# Patient Record
Sex: Female | Born: 1965
Health system: Southern US, Community
[De-identification: ages and names within clinical notes are randomized; demographics above are authoritative.]

## PROBLEM LIST (undated history)

## (undated) DIAGNOSIS — T7840XA Allergy, unspecified, initial encounter: Secondary | ICD-10-CM

## (undated) DIAGNOSIS — Z86010 Personal history of colonic polyps: Secondary | ICD-10-CM

## (undated) DIAGNOSIS — B441 Other pulmonary aspergillosis: Secondary | ICD-10-CM

## (undated) DIAGNOSIS — R918 Other nonspecific abnormal finding of lung field: Secondary | ICD-10-CM

## (undated) DIAGNOSIS — F419 Anxiety disorder, unspecified: Secondary | ICD-10-CM

## (undated) DIAGNOSIS — C801 Malignant (primary) neoplasm, unspecified: Secondary | ICD-10-CM

## (undated) DIAGNOSIS — F32A Depression, unspecified: Secondary | ICD-10-CM

## (undated) DIAGNOSIS — E785 Hyperlipidemia, unspecified: Secondary | ICD-10-CM

## (undated) HISTORY — PX: THORACOTOMY/LOBECTOMY: SHX6116

## (undated) HISTORY — DX: Allergy, unspecified, initial encounter: T78.40XA

## (undated) HISTORY — PX: KNEE ARTHROSCOPY: SUR90

## (undated) HISTORY — DX: Malignant (primary) neoplasm, unspecified: C80.1

## (undated) HISTORY — DX: Hyperlipidemia, unspecified: E78.5

## (undated) HISTORY — DX: Depression, unspecified: F32.A

## (undated) HISTORY — DX: Personal history of colonic polyps: Z86.010

## (undated) HISTORY — DX: Other pulmonary aspergillosis: B44.1

---

## 2003-12-30 ENCOUNTER — Other Ambulatory Visit: Admission: RE | Admit: 2003-12-30 | Discharge: 2003-12-30 | Payer: Self-pay | Admitting: Gynecology

## 2004-05-05 ENCOUNTER — Encounter: Admission: RE | Admit: 2004-05-05 | Discharge: 2004-05-05 | Payer: Self-pay | Admitting: Internal Medicine

## 2004-05-17 ENCOUNTER — Encounter: Admission: RE | Admit: 2004-05-17 | Discharge: 2004-05-17 | Payer: Self-pay | Admitting: Internal Medicine

## 2004-11-16 ENCOUNTER — Ambulatory Visit: Payer: Self-pay | Admitting: Internal Medicine

## 2005-05-12 ENCOUNTER — Inpatient Hospital Stay (HOSPITAL_COMMUNITY): Admission: RE | Admit: 2005-05-12 | Discharge: 2005-05-14 | Payer: Self-pay | Admitting: Obstetrics and Gynecology

## 2005-05-30 ENCOUNTER — Other Ambulatory Visit: Admission: RE | Admit: 2005-05-30 | Discharge: 2005-05-30 | Payer: Self-pay | Admitting: Obstetrics and Gynecology

## 2005-08-07 ENCOUNTER — Ambulatory Visit: Payer: Self-pay | Admitting: Psychology

## 2005-09-04 ENCOUNTER — Ambulatory Visit: Payer: Self-pay | Admitting: Psychology

## 2005-09-05 ENCOUNTER — Ambulatory Visit: Payer: Self-pay | Admitting: Internal Medicine

## 2005-09-11 ENCOUNTER — Ambulatory Visit: Payer: Self-pay | Admitting: Psychology

## 2005-09-12 ENCOUNTER — Ambulatory Visit: Payer: Self-pay | Admitting: Internal Medicine

## 2005-09-15 ENCOUNTER — Ambulatory Visit: Payer: Self-pay | Admitting: Psychology

## 2005-09-25 ENCOUNTER — Ambulatory Visit: Payer: Self-pay | Admitting: Psychology

## 2005-10-09 ENCOUNTER — Ambulatory Visit: Payer: Self-pay | Admitting: Psychology

## 2005-10-16 ENCOUNTER — Ambulatory Visit: Payer: Self-pay | Admitting: Psychology

## 2005-11-06 ENCOUNTER — Ambulatory Visit: Payer: Self-pay | Admitting: Psychology

## 2005-11-20 ENCOUNTER — Ambulatory Visit: Payer: Self-pay | Admitting: Psychology

## 2005-12-04 ENCOUNTER — Ambulatory Visit: Payer: Self-pay | Admitting: Psychology

## 2006-01-01 ENCOUNTER — Ambulatory Visit: Payer: Self-pay | Admitting: Psychology

## 2006-01-12 ENCOUNTER — Ambulatory Visit: Payer: Self-pay | Admitting: Endocrinology

## 2007-07-03 ENCOUNTER — Telehealth: Payer: Self-pay | Admitting: Internal Medicine

## 2007-08-17 ENCOUNTER — Encounter: Payer: Self-pay | Admitting: Internal Medicine

## 2007-08-17 DIAGNOSIS — F411 Generalized anxiety disorder: Secondary | ICD-10-CM | POA: Insufficient documentation

## 2007-11-15 ENCOUNTER — Telehealth (INDEPENDENT_AMBULATORY_CARE_PROVIDER_SITE_OTHER): Payer: Self-pay | Admitting: *Deleted

## 2010-06-16 ENCOUNTER — Ambulatory Visit: Payer: Self-pay | Admitting: Internal Medicine

## 2010-06-16 DIAGNOSIS — R109 Unspecified abdominal pain: Secondary | ICD-10-CM | POA: Insufficient documentation

## 2010-06-16 LAB — CONVERTED CEMR LAB
Bilirubin Urine: NEGATIVE
Hemoglobin, Urine: NEGATIVE
Leukocytes, UA: NEGATIVE
Specific Gravity, Urine: <=1.005 (ref 1.000–1.030)
Total Protein, Urine: NEGATIVE mg/dL
Urine Glucose: NEGATIVE mg/dL
Urobilinogen, UA: 0.2 (ref 0.0–1.0)

## 2010-06-17 ENCOUNTER — Telehealth: Payer: Self-pay | Admitting: Internal Medicine

## 2010-06-17 DIAGNOSIS — K219 Gastro-esophageal reflux disease without esophagitis: Secondary | ICD-10-CM | POA: Insufficient documentation

## 2010-06-17 DIAGNOSIS — F418 Other specified anxiety disorders: Secondary | ICD-10-CM | POA: Insufficient documentation

## 2010-11-20 ENCOUNTER — Encounter: Payer: Self-pay | Admitting: Internal Medicine

## 2010-12-01 NOTE — Progress Notes (Signed)
  Phone Note Outgoing Call   Reason for Call: Discuss lab or test results Summary of Call: U/A negative - stone unlikely. Will move ahead with U/S  [facility] will call. Late PM appt requested.  Initial call taken by: Jacques Navy MD,  June 17, 2010 8:33 AM  Follow-up for Phone Call         informed pt  Follow-up by: Ami Bullins CMA,  June 17, 2010 8:50 AM

## 2010-12-01 NOTE — Assessment & Plan Note (Signed)
Summary: STOMACH PAIN/NWS   Vital Signs:  Patient profile:   45 year old female Height:      64 inches Weight:      138 pounds BMI:     23.77 O2 Sat:      99 % on Room air Temp:     98.7 degrees F oral Pulse rate:   68 / minute BP sitting:   102 / 64  (left arm) Cuff size:   regular  Vitals Entered By: Bill Salinas CMA (June 16, 2010 3:22 PM)  O2 Flow:  Room air CC: pt here with c/o of pain on left side x 2 days/ ab   CC:  pt here with c/o of pain on left side x 2 days/ ab.  History of Present Illness: Patient is seen acutely for left flank pain.  Christina [name] usually enjoys good health. Several days ago she had the on-set of severe pain in the left flank which she rated as 7/10. It was slightly colicky in nature with some downward radiation. She denies Fever,sweats, chills, dysuria, hematuria, N/V. She has no prior GU problems. The pain has lessened although it is still present.   Current Medications (verified): 1)  Multivitamins   Tabs (Multiple Vitamin) .... 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  The patient denies anorexia, fever, weight loss, weight gain, chest pain, syncope, dyspnea on exertion, and prolonged cough.    Physical Exam  General:  Well-developed,well-nourished,in no acute distress; alert,appropriate and  cooperative throughout examination Head:  normocephalic.   Eyes:  pupils equal, pupils round, and corneas and lenses clear.   Neck:  full ROM.   Lungs:  normal respiratory effort.   Heart:  normal rate and regular rhythm.   Abdomen:  soft, normal bowel sounds, no distention, no guarding, and no rigidity.  Tender to percussion over the left flank. Tender to deep palpation at left quadrant at the level of the umbilicus. No lower abdominal tenderness Msk:  normal ROM, no redness over joints, and no joint deformities.   Pulses:  2+ radial Neurologic:  alert & oriented X3, cranial nerves II-XII intact, and gait normal.   Skin:  turgor normal, color normal, and no ulcerations.   Psych:  Oriented X3, memory intact for recent and remote, normally interactive, and good eye contact.     Impression & Recommendations:  Problem # 1:  FLANK PAIN, LEFT (ICD-789.09) New on-set flank pain suggestive of nephrolithiasis. No evidence of infection. Alternatively may be muscle strain vs renal parenchymal disease.  Plan - U/A           if negative will move ahead with renal U/S  Orders: TLB-Udip w/ Micro (81001-URINE) Radiology Referral (Radiology)  Addendum -U/A negative on dipstick, no micro report.   Plan - schedule renal U/S  Complete Medication List: 1)  Multivitamins  Tabs (Multiple vitamin) .... Take 1 by mouth qd

## 2011-03-17 NOTE — Op Note (Signed)
Christina, [name]                 ACCOUNT NO.:  [id number]   MEDICAL RECORD NO.:  [id number]          PATIENT TYPE:  INP   LOCATION:  9117                          FACILITY:  [facility]   PHYSICIAN:  Malva Limes, M.D.    DATE OF BIRTH:  05-Jun-1966   DATE OF PROCEDURE:  05/12/2005  DATE OF DISCHARGE:                                 OPERATIVE REPORT   PREOPERATIVE DIAGNOSES:  1.  Intrauterine pregnancy at term.  2.  History of previous child with cerebral palsy.  3.  Advanced maternal age.  4.  Patient desires elective cesarean section.   POSTOPERATIVE DIAGNOSES:  1.  Intrauterine pregnancy at term.  2.  History of previous child with cerebral palsy.  3.  Advanced maternal age.  4.  Patient desires elective cesarean section.   PROCEDURE:  Primary elective cesarean section.   SURGEON:  Malva Limes, M.D.   ANESTHESIA:  Spinal.   ANTIBIOTICS:  Ancef 1 g.   ESTIMATED BLOOD LOSS:  900 mL.   COMPLICATIONS:  None.   SPECIMENS:  None.   DRAINS:  Foley to bedside drainage.   FINDINGS:  The patient had normal fallopian tubes and ovaries bilaterally.  The uterus appeared to be normal.  The patient delivered one live viable  white female infant.  The placenta appeared to be normal.   PROCEDURE:  The patient was taken to the operating room, where a spinal  anesthetic was placed without difficulty.  She was then placed in the dorsal  supine position with a left lateral tilt.  She was prepped with Hibiclens  and draped in the usual fashion for this procedure.  A Foley catheter was  placed in her bladder.  She was then checked and anesthetic level was  adequate.  A Pfannenstiel incision was made.  This was carried down to the  fascia.  The fascia was entered in the midline and extended laterally with  the Mayo scissors.  The muscles were dissected from the fascia with the  Bovie.  Rectus muscles were divided in the midline and taken superiorly and  inferiorly.  The parietal peritoneum  was entered sharply.  The bladder blade  was taken down sharply.  A low transverse uterine incision was made in the  midline and extended laterally with blunt dissection.  Amniotic fluid was  noted to be clear.  The infant was delivered in the vertex presentation.  On  delivery of the head, the oropharynx was then bulb-suctioned.  The infant  was handed to the waiting NICU team.  Cord blood was then obtained.  The  placenta was manually removed.  The uterus was explored and wiped with a wet  lap.  The uterine incision was closed in a single layer of 0 Monocryl in a  running locking fashion.  The bladder flap was closed using 2-0 Monocryl in  a running fashion.  The uterus was placed back in the abdominal cavity.  Hemostasis was checked and found to be adequate.  The parietal peritoneum  and rectus muscles were reapproximated in the midline using 2-0 Monocryl in  a running fashion, and the fascia was closed using 0 Monocryl suture in a  running fashion.  Subcuticular tissue was made hemostatic with Bovie.  Stainless steel clips were used to close the skin.  The patient tolerated  the procedure well, and she was taken to the recovery room in stable  condition.  Instrument and lap counts were correct x2.       MA/MEDQ  D:  05/12/2005  T:  05/12/2005  Job:  [id number]

## 2011-03-17 NOTE — Discharge Summary (Signed)
NAMEYASMENE, Christina [name]                 ACCOUNT NO.:  [id number]   MEDICAL RECORD NO.:  [id number]          PATIENT TYPE:  INP   LOCATION:  9117                          FACILITY:  [facility]   PHYSICIAN:  Malva Limes, M.D.    DATE OF BIRTH:  [birth year]   DATE OF ADMISSION:  05/12/2005  DATE OF DISCHARGE:  05/14/2005                                 DISCHARGE SUMMARY   FINAL DIAGNOSES:  1.  Intrauterine pregnancy at term.  2.  History of a previous child with cerebral palsy.  3.  Advanced maternal age.  4.  The patient desires elective cesarean section.   PROCEDURE:  Primary elective low transverse cesarean section.   SURGEON:  Dr. Malva Limes.   COMPLICATIONS:  None.   HOSPITAL COURSE:  This 45 year old G3, P 1-0-1-1 presents at term for an  elective cesarean section. The patient had had a previous child born with  cerebral palsy and desires a cesarean section with this pregnancy.  The  patient is also advanced maternal age. She did have first trimester  screening performed which was within normal limits. The patient herself was  born with questionable polio.  Otherwise, the patient's antepartum course  had been uncomplicated. She did have a negative group B strep culture  obtained in the office at 35 weeks. She was taken to the operating room on  May 12, 2005 by Dr. Malva Limes where a primary low transverse cesarean  section was performed with the delivery of a 7 pound, 9 ounce female infant  without Apgars of 8/9.  Delivery went without complications. The patient's  postoperative course was benign without significant fevers. She was felt  ready for discharge on postoperative day #2.   DISCHARGE DIET:  She was sent home on a regular diet.   ACTIVITY:  She was told to decrease activities.   DISCHARGE MEDICATIONS:  1.  She was told to continue her prenatal vitamins.  2.  She was given Percocet 1-2 every 4 hours as needed for pain.  3.  She was told she could use Motrin up to 600  milligrams every 6 hours as      needed for pain.   FOLLOW UP:  She was to follow up in the office in 2 weeks with Dr. Dareen Piano  for an incision check.   LABORATORIES ON DISCHARGE:  The patient had a hemoglobin of 9.6, white blood  cell count of 11.0, platelets of 141,000.      Leilani Able, P.A.-C.    ______________________________  Malva Limes, M.D.    MB/MEDQ  D:  06/15/2005  T:  06/15/2005  Job:  [id number]

## 2012-12-17 ENCOUNTER — Encounter: Payer: Self-pay | Admitting: *Deleted

## 2012-12-18 ENCOUNTER — Ambulatory Visit (INDEPENDENT_AMBULATORY_CARE_PROVIDER_SITE_OTHER): Payer: Managed Care, Other (non HMO) | Admitting: *Deleted

## 2012-12-18 ENCOUNTER — Encounter: Payer: Self-pay | Admitting: Vascular Surgery

## 2012-12-18 DIAGNOSIS — I781 Nevus, non-neoplastic: Secondary | ICD-10-CM

## 2012-12-18 NOTE — Progress Notes (Signed)
X=.3% Sotradecol administered with a 27g butterfly.  Patient received a total of 6cc.  This nice lady has areas behind each knee that were easily treated and I anticipate good results. Will follow her prn.  Photos: yes  Compression stockings applied: yes

## 2014-10-30 HISTORY — PX: COLONOSCOPY: SHX174

## 2014-12-24 ENCOUNTER — Telehealth: Payer: Self-pay | Admitting: Internal Medicine

## 2014-12-24 NOTE — Telephone Encounter (Signed)
Rec'd from [facility] forward 13 pages to Dr. Olevia Perches

## 2014-12-29 ENCOUNTER — Encounter: Payer: Self-pay | Admitting: Internal Medicine

## 2015-03-05 ENCOUNTER — Encounter: Payer: Self-pay | Admitting: Internal Medicine

## 2015-03-05 ENCOUNTER — Ambulatory Visit (AMBULATORY_SURGERY_CENTER): Payer: Self-pay

## 2015-03-05 VITALS — Ht 62.0 in | Wt 136.8 lb

## 2015-03-05 DIAGNOSIS — Z800 Family history of malignant neoplasm of digestive organs: Secondary | ICD-10-CM

## 2015-03-05 NOTE — Progress Notes (Signed)
Per pt, no allergies to soy or egg products.Pt not taking any weight loss meds or using  O2 at home. 

## 2015-03-19 ENCOUNTER — Encounter: Payer: Self-pay | Admitting: Internal Medicine

## 2015-03-19 ENCOUNTER — Ambulatory Visit (AMBULATORY_SURGERY_CENTER): Payer: Managed Care, Other (non HMO) | Admitting: Internal Medicine

## 2015-03-19 VITALS — BP 113/56 | HR 50 | Temp 97.6°F | Resp 22 | Ht 62.0 in | Wt 136.0 lb

## 2015-03-19 DIAGNOSIS — Z86010 Personal history of colonic polyps: Secondary | ICD-10-CM | POA: Diagnosis present

## 2015-03-19 DIAGNOSIS — D123 Benign neoplasm of transverse colon: Secondary | ICD-10-CM

## 2015-03-19 DIAGNOSIS — Z800 Family history of malignant neoplasm of digestive organs: Secondary | ICD-10-CM | POA: Diagnosis not present

## 2015-03-19 DIAGNOSIS — D124 Benign neoplasm of descending colon: Secondary | ICD-10-CM

## 2015-03-19 DIAGNOSIS — Z860101 Personal history of adenomatous and serrated colon polyps: Secondary | ICD-10-CM

## 2015-03-19 DIAGNOSIS — D125 Benign neoplasm of sigmoid colon: Secondary | ICD-10-CM | POA: Diagnosis not present

## 2015-03-19 HISTORY — DX: Personal history of colonic polyps: Z86.010

## 2015-03-19 HISTORY — DX: Personal history of adenomatous and serrated colon polyps: Z86.0101

## 2015-03-19 MED ORDER — SODIUM CHLORIDE 0.9 % IV SOLN: 500.0000 mL | INTRAVENOUS | Status: DC

## 2015-03-19 NOTE — Op Note (Addendum)
[place]  [address] [place], [address]   COLONOSCOPY PROCEDURE REPORT  PATIENT: Christina [name], Christina [name]  MR#: [id number] BIRTHDATE: December 12, 1965 , 60  yrs. old GENDER: female ENDOSCOPIST: Gatha Mayer, MD, [facility] PROCEDURE DATE:  03/19/2015 PROCEDURE:   Colonoscopy, surveillance and Colonoscopy with snare polypectomy First Screening Colonoscopy - Avg.  risk and is 50 yrs.  old or older - No.  Prior Negative Screening - Now for repeat screening. N/A  History of Adenoma - Now for follow-up colonoscopy & has been > or = to 3 yrs.  Yes hx of adenoma.  Has been 3 or more years since last colonoscopy.  Polyps removed today? Yes ASA CLASS:   Class II INDICATIONS:Surveillance due to prior colonic neoplasia, PH Colon Adenoma, and FH Colon or Rectal Adenocarcinoma. MEDICATIONS: Propofol 400 mg IV and Monitored anesthesia care  DESCRIPTION OF PROCEDURE:   After the risks benefits and alternatives of the procedure were thoroughly explained, informed consent was obtained.  The digital rectal exam revealed no abnormalities of the rectum.   The LB [id number]-H190 [id number]  endoscope was introduced through the anus and advanced to the cecum, which was identified by both the appendix and ileocecal valve. No adverse events experienced.   The quality of the prep was good.  (MiraLax was used)  The instrument was then slowly withdrawn as the colon was fully examined. Estimated blood loss is zero unless otherwise noted in this procedure report.  COLON FINDINGS: Three polyps ranging from 5 to 18mm in size were found in the transverse colon, descending colon, and sigmoid colon (20 mm pedunculated) .  Polypectomies were performed with a cold snare and using snare cautery (sigmoid).  The resection was complete, the polyp tissue was completely retrieved and sent to histology.   There was diverticulosis noted in the sigmoid colon. The examination was otherwise normal.  Retroflexed views revealed no  abnormalities. The time to cecum = 6.6 Withdrawal time = 15.3 The scope was withdrawn and the procedure completed. COMPLICATIONS: There were no immediate complications.  ENDOSCOPIC IMPRESSION: 1.   Three polyps ranging from 5 to 75mm in size were found in the transverse colon, descending colon, and sigmoid colon; polypectomies were performed with a cold snare and using snare cautery 2.   Diverticulosis was noted in the sigmoid colon 3.   The examination was otherwise normal - good prep - hx ssa and FHx CRCA parent and grandparent  RECOMMENDATIONS: 1.  Timing of repeat colonoscopy will be determined by pathology findings. 2.  Hold Aspirin and all other NSAIDS for 2 weeks.  eSigned:  Gatha Mayer, MD, [facility] Regal 03/19/2015 2:09 PM Revised: 03/19/2015 2:09 PM  cc: Antony Blackbird, MD and The Patient

## 2015-03-19 NOTE — Progress Notes (Signed)
To Pacu, pt awake and alert pleased with MAC. Report to RN

## 2015-03-19 NOTE — Patient Instructions (Signed)
YOU HAD AN ENDOSCOPIC PROCEDURE TODAY AT [facility]:   Refer to the procedure report that was given to you for any specific questions about what was found during the examination.  If the procedure report does not answer your questions, please call your gastroenterologist to clarify.  If you requested that your care partner not be given the details of your procedure findings, then the procedure report has been included in a sealed envelope for you to review at your convenience later.  YOU SHOULD EXPECT: Some feelings of bloating in the abdomen. Passage of more gas than usual.  Walking can help get rid of the air that was put into your GI tract during the procedure and reduce the bloating. If you had a lower endoscopy (such as a colonoscopy or flexible sigmoidoscopy) you may notice spotting of blood in your stool or on the toilet paper. If you underwent a bowel prep for your procedure, you may not have a normal bowel movement for a few days.  Please Note:  You might notice some irritation and congestion in your nose or some drainage.  This is from the oxygen used during your procedure.  There is no need for concern and it should clear up in a day or so.  SYMPTOMS TO REPORT IMMEDIATELY:   Following lower endoscopy (colonoscopy or flexible sigmoidoscopy):  Excessive amounts of blood in the stool  Significant tenderness or worsening of abdominal pains  Swelling of the abdomen that is new, acute  Fever of 100F or higher  For urgent or emergent issues, a gastroenterologist can be reached at any hour by calling [phone].  DIET: Your first meal following the procedure should be a small meal and then it is ok to progress to your normal diet. Heavy or fried foods are harder to digest and may make you feel nauseous or bloated.  Likewise, meals heavy in dairy and vegetables can increase bloating.  Drink plenty of fluids but you should avoid alcoholic beverages for 24 hours.  ACTIVITY:   You should plan to take it easy for the rest of today and you should NOT DRIVE or use heavy machinery until tomorrow (because of the sedation medicines used during the test).    FOLLOW UP: Our staff will call the number listed on your records the next business day following your procedure to check on you and address any questions or concerns that you may have regarding the information given to you following your procedure. If we do not reach you, we will leave a message.  However, if you are feeling well and you are not experiencing any problems, there is no need to return our call.  We will assume that you have returned to your regular daily activities without incident.  If any biopsies were taken you will be contacted by phone or by letter within the next 1-3 weeks.  Please call us at [phone] if you have not heard about the biopsies in 3 weeks.   SIGNATURES/CONFIDENTIALITY: You and/or your care partner have signed paperwork which will be entered into your electronic medical record.  These signatures attest to the fact that that the information above on your After Visit Summary has been reviewed and is understood.  Full responsibility of the confidentiality of this discharge information lies with you and/or your care-partner.  Hold Aspirin, Aspirin containing products (BC powders, Goody powders), NSAIDS (Ibuprofen, Advil, Aleve, Motrin) for 2 weeks- Tylenol is ok  Please read over handouts about polyps  and diverticulosis

## 2015-03-19 NOTE — Progress Notes (Signed)
Called to room to assist during endoscopic procedure.  Patient ID and intended procedure confirmed with present staff. Received instructions for my participation in the procedure from the performing physician.  

## 2015-03-22 ENCOUNTER — Telehealth: Payer: Self-pay | Admitting: *Deleted

## 2015-03-22 NOTE — Telephone Encounter (Signed)
  Follow up Call-  Call back number 03/19/2015  Post procedure Call Back phone  # [phone]  Permission to leave phone message Yes     Patient questions:  Message left to call us if necessary.

## 2015-03-31 ENCOUNTER — Encounter: Payer: Self-pay | Admitting: Internal Medicine

## 2015-03-31 DIAGNOSIS — Z86010 Personal history of colonic polyps: Secondary | ICD-10-CM

## 2015-03-31 DIAGNOSIS — Z800 Family history of malignant neoplasm of digestive organs: Secondary | ICD-10-CM | POA: Insufficient documentation

## 2015-03-31 NOTE — Progress Notes (Signed)
Quick Note:  20 mm adenoma, small adenoma dn small ssp/a no dysplasia Repeat colonoscopy 2019 ______

## 2015-04-09 NOTE — Telephone Encounter (Signed)
Pt seen

## 2016-01-11 DIAGNOSIS — H9319 Tinnitus, unspecified ear: Secondary | ICD-10-CM | POA: Insufficient documentation

## 2016-01-11 DIAGNOSIS — H6983 Other specified disorders of Eustachian tube, bilateral: Secondary | ICD-10-CM | POA: Insufficient documentation

## 2016-01-11 DIAGNOSIS — H903 Sensorineural hearing loss, bilateral: Secondary | ICD-10-CM | POA: Insufficient documentation

## 2016-09-05 ENCOUNTER — Other Ambulatory Visit: Payer: Self-pay | Admitting: Obstetrics and Gynecology

## 2016-09-06 LAB — CYTOLOGY - PAP

## 2017-09-25 ENCOUNTER — Emergency Department (HOSPITAL_COMMUNITY)
Admission: EM | Admit: 2017-09-25 | Discharge: 2017-09-25 | Disposition: A | Discharge status: Home / Self Care | Payer: Managed Care, Other (non HMO) | Attending: Emergency Medicine | Admitting: Emergency Medicine

## 2017-09-25 ENCOUNTER — Encounter (HOSPITAL_COMMUNITY): Payer: Self-pay

## 2017-09-25 DIAGNOSIS — Z23 Encounter for immunization: Secondary | ICD-10-CM | POA: Diagnosis not present

## 2017-09-25 DIAGNOSIS — Y998 Other external cause status: Secondary | ICD-10-CM | POA: Diagnosis not present

## 2017-09-25 DIAGNOSIS — W260XXA Contact with knife, initial encounter: Secondary | ICD-10-CM | POA: Insufficient documentation

## 2017-09-25 DIAGNOSIS — Y93G1 Activity, food preparation and clean up: Secondary | ICD-10-CM | POA: Diagnosis not present

## 2017-09-25 DIAGNOSIS — Z87891 Personal history of nicotine dependence: Secondary | ICD-10-CM | POA: Insufficient documentation

## 2017-09-25 DIAGNOSIS — S61213A Laceration without foreign body of left middle finger without damage to nail, initial encounter: Secondary | ICD-10-CM | POA: Diagnosis present

## 2017-09-25 DIAGNOSIS — Y929 Unspecified place or not applicable: Secondary | ICD-10-CM | POA: Diagnosis not present

## 2017-09-25 DIAGNOSIS — S61213D Laceration without foreign body of left middle finger without damage to nail, subsequent encounter: Secondary | ICD-10-CM

## 2017-09-25 MED ORDER — POVIDONE-IODINE 10 % EX SOLN
CUTANEOUS | Status: AC
  Filled 2017-09-25: qty 15

## 2017-09-25 MED ORDER — TETANUS-DIPHTH-ACELL PERTUSSIS 5-2.5-18.5 LF-MCG/0.5 IM SUSP
0.5000 mL | Freq: Once | INTRAMUSCULAR | Status: AC
  Administered 2017-09-25: 0.5 mL via INTRAMUSCULAR
  Filled 2017-09-25: qty 0.5

## 2017-09-25 NOTE — Discharge Instructions (Signed)
Your wound was repaired with Steri-Strips.  The should stay on for about 5-7 days and then come off on their own.  The area of skin that was injured is very thin, and may or may not adhere to the tissue on the tip of the finger.  The same thing is true of the corner of the fingernail.  Please see your primary physician or return to the emergency department if any signs of advancing infection.  Please use a glove if you are in the shower.  Keep your finger clean and dry so that it has the best chance to heal and to prevent infection.

## 2017-09-25 NOTE — ED Provider Notes (Signed)
[facility] EMERGENCY DEPARTMENT Provider Note   CSN: [id number] Arrival date & time: 09/25/17  6811     History   Chief Complaint Chief Complaint  Patient presents with  . Laceration    HPI Christina [name] is a 51 y.o. female.  Patient is a 51 year old female who presents to the emergency department with a laceration to the left middle finger. Patient states she was cutting onions with a new knife when she accidentally cut her left middle finger.  She was able to get the bleeding to control with applying pressure.  She noted however that a portion of the nail was cut and she thought she should come to the emergency department to get this checked.  The patient is unsure of the date of her last tetanus.   The history is provided by the patient.  Laceration      Past Medical History:  Diagnosis Date  . Hx of adenomatous and sessile serrated colonic polyps 03/19/2015    Patient Active Problem List   Diagnosis Date Noted  . Family history of colon cancer 03/31/2015  . Hx of adenomatous and sessile serrated colonic polyps 03/19/2015  . DEPRESSION, MILD 06/17/2010  . GERD 06/17/2010  . FLANK PAIN, LEFT 06/16/2010  . ANXIETY 08/17/2007    Past Surgical History:  Procedure Laterality Date  . CESAREAN SECTION    . KNEE ARTHROSCOPY     right knee    OB History    No data available       Home Medications    Prior to Admission medications   Medication Sig Start Date End Date Taking? Authorizing Provider  B Complex Vitamins (B COMPLEX 50 PO) Take by mouth daily.   Yes [provider]  ibuprofen (ADVIL,MOTRIN) 200 MG tablet Take 200 mg by mouth every 6 (six) hours as needed.   Yes [provider]    Family History Family History  Problem Relation Age of Onset  . Bladder Cancer Mother   . Colon cancer Father   . Colon cancer Maternal Grandmother   . Heart disease Brother     Social History Social History   Tobacco Use  . Smoking status:  Former Smoker    Last attempt to quit: 03/18/2002    Years since quitting: 15.5  . Smokeless tobacco: Never Used  Substance Use Topics  . Alcohol use: Yes    Alcohol/week: 2.4 oz    Types: 4 Glasses of wine per week    Comment: per week  . Drug use: No     Allergies   Patient has no known allergies.   Review of Systems Review of Systems  Constitutional: Negative for activity change.       All ROS Neg except as noted in HPI  HENT: Negative for nosebleeds.   Eyes: Negative for photophobia and discharge.  Respiratory: Negative for cough, shortness of breath and wheezing.   Cardiovascular: Negative for chest pain and palpitations.  Gastrointestinal: Negative for abdominal pain and blood in stool.  Genitourinary: Negative for dysuria, frequency and hematuria.  Musculoskeletal: Negative for arthralgias, back pain and neck pain.  Skin: Negative.   Neurological: Negative for dizziness, seizures and speech difficulty.  Psychiatric/Behavioral: Negative for confusion and hallucinations.     Physical Exam Updated Vital Signs BP 108/66 (BP Location: Right Arm)   Pulse 63   Temp 97.7 F (36.5 C) (Temporal)   Resp 20   Ht 5\' 2"  (1.575 m)   Wt 63.5 kg (140  lb)   SpO2 100%   BMI 25.61 kg/m   Physical Exam  Constitutional: She is oriented to person, place, and time. She appears well-developed and well-nourished.  Non-toxic appearance.  HENT:  Head: Normocephalic.  Right Ear: Tympanic membrane and external ear normal.  Left Ear: Tympanic membrane and external ear normal.  Eyes: EOM and lids are normal. Pupils are equal, round, and reactive to light.  Neck: Normal range of motion. Neck supple. Carotid bruit is not present.  Cardiovascular: Normal rate, regular rhythm, normal heart sounds, intact distal pulses and normal pulses.  Pulmonary/Chest: Breath sounds normal. No respiratory distress.  Abdominal: Soft. Bowel sounds are normal. There is no tenderness. There is no guarding.    Musculoskeletal: Normal range of motion.       Left hand: She exhibits laceration.       Hands: Lymphadenopathy:       Head (right side): No submandibular adenopathy present.       Head (left side): No submandibular adenopathy present.    She has no cervical adenopathy.  Neurological: She is alert and oriented to person, place, and time. She has normal strength. No cranial nerve deficit or sensory deficit.  Skin: Skin is warm and dry.  Psychiatric: She has a normal mood and affect. Her speech is normal.  Nursing note and vitals reviewed.    ED Treatments / Results  Labs (all labs ordered are listed, but only abnormal results are displayed) Labs Reviewed - No data to display  EKG  EKG Interpretation None       Radiology No results found.  Procedures .Marland KitchenLaceration Repair Date/Time: 09/25/2017 12:00 PM Performed by: Lily Kocher, PA-C Authorized by: Lily Kocher, PA-C   Consent:    Consent obtained:  Verbal   Consent given by:  Patient   Risks discussed:  Infection, poor cosmetic result and poor wound healing Anesthesia (see MAR for exact dosages):    Anesthesia method:  None Laceration details:    Location:  Finger   Finger location:  L long finger   Length (cm):  1.2 Repair type:    Repair type:  Simple Pre-procedure details:    Preparation:  Patient was prepped and draped in usual sterile fashion Exploration:    Hemostasis achieved with:  Direct pressure   Wound extent: no foreign bodies/material noted and no tendon damage noted   Treatment:    Area cleansed with:  Betadine and saline   Amount of cleaning:  Standard   Irrigation solution:  Sterile saline Skin repair:    Repair method:  Steri-Strips   Number of Steri-Strips:  2 Approximation:    Approximation:  Close Post-procedure details:    Dressing:  Open (no dressing)   Patient tolerance of procedure:  Tolerated well, no immediate complications   (including critical care time)  Medications  Ordered in ED Medications  povidone-iodine (BETADINE) 10 % external solution (not administered)  Tdap (BOOSTRIX) injection 0.5 mL (0.5 mLs Intramuscular Given 09/25/17 1111)     Initial Impression / Assessment and Plan / ED Course  I have reviewed the triage vital signs and the nursing notes.  Pertinent labs & imaging results that were available during my care of the patient were reviewed by me and considered in my medical decision making (see chart for details).       Final Clinical Impressions(s) / ED Diagnoses MDM Vital signs reviewed.  Patient's tetanus status was updated.  No bone involvement.  No tendon involvement noted.  The wound  was irrigated and then soaked in Betadine and water solution.  Following this the flap was noted to be quite thin.  There is only a very small portion of the nail that was involved.  The wound was repaired with Steri-Strips.  I discussed with the patient the importance of seeing the primary physician or returning to the emergency department if any signs of advancing infection.  Patient acknowledges understanding of the instructions.   Final diagnoses:  Laceration of left middle finger without foreign body, nail damage status unspecified, subsequent encounter    ED Discharge Orders    None       Lily Kocher, PA-C 09/25/17 1203    Milton Ferguson, MD 09/25/17 [phone]

## 2017-09-25 NOTE — ED Triage Notes (Signed)
Pt reports accidentally cut left middle finger with a new knife while cutting onions this morning.  Report she cut part of her nail bed.  Bleeding controlled.

## 2017-10-31 DIAGNOSIS — J3489 Other specified disorders of nose and nasal sinuses: Secondary | ICD-10-CM | POA: Diagnosis not present

## 2017-10-31 DIAGNOSIS — R0982 Postnasal drip: Secondary | ICD-10-CM | POA: Diagnosis not present

## 2017-10-31 DIAGNOSIS — R5383 Other fatigue: Secondary | ICD-10-CM | POA: Diagnosis not present

## 2017-10-31 DIAGNOSIS — J329 Chronic sinusitis, unspecified: Secondary | ICD-10-CM | POA: Diagnosis not present

## 2017-11-07 DIAGNOSIS — Z6824 Body mass index (BMI) 24.0-24.9, adult: Secondary | ICD-10-CM | POA: Diagnosis not present

## 2017-11-07 DIAGNOSIS — Z803 Family history of malignant neoplasm of breast: Secondary | ICD-10-CM | POA: Diagnosis not present

## 2017-11-07 DIAGNOSIS — Z01419 Encounter for gynecological examination (general) (routine) without abnormal findings: Secondary | ICD-10-CM | POA: Diagnosis not present

## 2017-11-07 DIAGNOSIS — Z800 Family history of malignant neoplasm of digestive organs: Secondary | ICD-10-CM | POA: Diagnosis not present

## 2017-11-07 DIAGNOSIS — Z8489 Family history of other specified conditions: Secondary | ICD-10-CM | POA: Insufficient documentation

## 2017-11-07 DIAGNOSIS — Z1231 Encounter for screening mammogram for malignant neoplasm of breast: Secondary | ICD-10-CM | POA: Diagnosis not present

## 2017-11-07 DIAGNOSIS — Z808 Family history of malignant neoplasm of other organs or systems: Secondary | ICD-10-CM | POA: Diagnosis not present

## 2017-11-07 DIAGNOSIS — Z8052 Family history of malignant neoplasm of bladder: Secondary | ICD-10-CM | POA: Diagnosis not present

## 2017-11-07 DIAGNOSIS — Z124 Encounter for screening for malignant neoplasm of cervix: Secondary | ICD-10-CM | POA: Diagnosis not present

## 2017-11-19 DIAGNOSIS — R52 Pain, unspecified: Secondary | ICD-10-CM | POA: Diagnosis not present

## 2017-11-19 DIAGNOSIS — J111 Influenza due to unidentified influenza virus with other respiratory manifestations: Secondary | ICD-10-CM | POA: Diagnosis not present

## 2017-11-19 DIAGNOSIS — J101 Influenza due to other identified influenza virus with other respiratory manifestations: Secondary | ICD-10-CM | POA: Diagnosis not present

## 2017-11-19 DIAGNOSIS — J988 Other specified respiratory disorders: Secondary | ICD-10-CM | POA: Diagnosis not present

## 2017-11-23 DIAGNOSIS — J018 Other acute sinusitis: Secondary | ICD-10-CM | POA: Diagnosis not present

## 2017-11-23 DIAGNOSIS — H6983 Other specified disorders of Eustachian tube, bilateral: Secondary | ICD-10-CM | POA: Diagnosis not present

## 2018-01-16 ENCOUNTER — Encounter: Payer: Self-pay | Admitting: Family Medicine

## 2018-01-16 ENCOUNTER — Other Ambulatory Visit: Payer: Self-pay

## 2018-01-16 ENCOUNTER — Ambulatory Visit (INDEPENDENT_AMBULATORY_CARE_PROVIDER_SITE_OTHER): Payer: BLUE CROSS/BLUE SHIELD | Admitting: Family Medicine

## 2018-01-16 VITALS — BP 110/62 | HR 58 | Temp 97.6°F | Resp 14 | Ht 63.0 in | Wt 142.0 lb

## 2018-01-16 DIAGNOSIS — Z0000 Encounter for general adult medical examination without abnormal findings: Secondary | ICD-10-CM

## 2018-01-16 DIAGNOSIS — Z1321 Encounter for screening for nutritional disorder: Secondary | ICD-10-CM

## 2018-01-16 DIAGNOSIS — L719 Rosacea, unspecified: Secondary | ICD-10-CM | POA: Diagnosis not present

## 2018-01-16 DIAGNOSIS — H9312 Tinnitus, left ear: Secondary | ICD-10-CM

## 2018-01-16 MED ORDER — METRONIDAZOLE 1 % EX GEL: Freq: Every day | CUTANEOUS | 1 refills | Status: DC

## 2018-01-16 NOTE — Patient Instructions (Addendum)
Release of records [facility] - Dr. Ouida Sills  Try the metrogel  Shingles vaccine sent to pharmacy  F/U 1 year for physical

## 2018-01-16 NOTE — Progress Notes (Addendum)
   Subjective:    Patient ID: Christina [name], female    DOB: 03-25-1966, 52 y.o.   MRN: [id number]  Patient presents for CPE (is fasting)   Last PCP Dr. Chapman Fitch, years ago    Familyhistory colon cancer- fathers side, has had multiple polyps, has higher risk for cancer based on genetic testing    Dr. Carlean Purl -GI, due for colonoscopy   Dr. Ouida Sills- GYN, had PAP smear    Note she has had HIV screening in past- had false positive, saw ID, does not have HIV   Chronic tinnustis saw ENT in the past, has used benadryl regimen, as well as allegra, flonase, now that she has allergies   Dr. Benjamine Mola has appt next month   Treated for sinusitis at [facility] back in Dec took steroids and antibioitcs  Facial rash, has rosacea noticed more redness on forehead and chin, with bumps on chin, mild redness on cheeks , uses regular drug store makeup, clansers D[employer], ponds, has not changed anything       Review Of Systems:  GEN- denies fatigue, fever, weight loss,weakness, recent illness HEENT- denies eye drainage, change in vision, nasal discharge, CVS- denies chest pain, palpitations RESP- denies SOB, cough, wheeze ABD- denies N/V, change in stools, abd pain GU- denies dysuria, hematuria, dribbling, incontinence MSK- denies joint pain, muscle aches, injury Neuro- denies headache,+ dizziness, syncope, seizure activity       Objective:    BP 110/62   Pulse (!) 58   Temp 97.6 F (36.4 C) (Oral)   Resp 14   Ht 5\' 3"  (1.6 m)   Wt 142 lb (64.4 kg)   SpO2 96%   BMI 25.15 kg/m  GEN- NAD, alert and oriented x3 HEENT- PERRL, EOMI, non injected sclera, pink conjunctiva, MMM, oropharynx clear, tm clear bilat no effusion  Neck- Supple, no thyromegaly CVS- RRR, no murmur RESP-CTAB ABD-NABS,soft,NT,ND Skin- erythema with small fine maculopapular lesions on forehead and chin Psych- normal affect and mood  EXT- No edema Pulses- Radial, DP- 2+        Assessment & Plan:     obtain records from GYN    Problem List Items Addressed This Visit      Unprioritized   Tinnitus    f/u ENT, continue allergy meds, she is decreasing using of benadryl using allegra, nasal steroid        Other Visit Diagnoses    Routine general medical examination at a health care facility    -  Primary   CPE done, immunizations discussed shingles vaccine, sent to pharmacy. fasting labs. she will schedule colonocopy   Relevant Orders   CBC with Differential/Platelet (Completed)   Comprehensive metabolic panel (Completed)   Lipid panel (Completed)   TSH (Completed)   Encounter for vitamin deficiency screening       Relevant Orders   Vitamin D, 25-hydroxy (Completed)   Rosacea       Trial of metrogel, if not improved dermatology referral       Note: This dictation was prepared with Dragon dictation along with smaller phrase technology. Any transcriptional errors that result from this process are unintentional.

## 2018-01-17 ENCOUNTER — Other Ambulatory Visit: Payer: Self-pay | Admitting: *Deleted

## 2018-01-17 ENCOUNTER — Encounter: Payer: Self-pay | Admitting: Family Medicine

## 2018-01-17 LAB — COMPREHENSIVE METABOLIC PANEL
AG RATIO: 1.6 (calc) (ref 1.0–2.5)
ALBUMIN MSPROF: 4.1 g/dL (ref 3.6–5.1)
ALT: 16 U/L (ref 6–29)
AST: 20 U/L (ref 10–35)
Alkaline phosphatase (APISO): 63 U/L (ref 33–130)
BUN: 12 mg/dL (ref 7–25)
CHLORIDE: 106 mmol/L (ref 98–110)
CO2: 28 mmol/L (ref 20–32)
Calcium: 9.2 mg/dL (ref 8.6–10.4)
Creat: 0.77 mg/dL (ref 0.50–1.05)
GLOBULIN: 2.6 g/dL (ref 1.9–3.7)
GLUCOSE: 88 mg/dL (ref 65–99)
POTASSIUM: 4.4 mmol/L (ref 3.5–5.3)
SODIUM: 139 mmol/L (ref 135–146)
TOTAL PROTEIN: 6.7 g/dL (ref 6.1–8.1)
Total Bilirubin: 0.3 mg/dL (ref 0.2–1.2)

## 2018-01-17 LAB — LIPID PANEL
Cholesterol: 210 mg/dL — ABNORMAL HIGH (ref <200)
HDL: 67 mg/dL (ref >50)
LDL Cholesterol (Calc): 124 mg/dL (calc) — ABNORMAL HIGH
NON-HDL CHOLESTEROL (CALC): 143 mg/dL — AB (ref <130)
TRIGLYCERIDES: 86 mg/dL (ref <150)
Total CHOL/HDL Ratio: 3.1 (calc) (ref <5.0)

## 2018-01-17 LAB — CBC WITH DIFFERENTIAL/PLATELET
BASOS PCT: 0.5 %
Basophils Absolute: 20 cells/uL (ref 0–200)
EOS PCT: 1.3 %
Eosinophils Absolute: 51 cells/uL (ref 15–500)
HEMATOCRIT: 38.2 % (ref 35.0–45.0)
HEMOGLOBIN: 12.9 g/dL (ref 11.7–15.5)
LYMPHS ABS: 1743 {cells}/uL (ref 850–3900)
MCH: 32 pg (ref 27.0–33.0)
MCHC: 33.8 g/dL (ref 32.0–36.0)
MCV: 94.8 fL (ref 80.0–100.0)
MONOS PCT: 8.1 %
MPV: 11.1 fL (ref 7.5–12.5)
NEUTROS ABS: 1771 {cells}/uL (ref 1500–7800)
Neutrophils Relative %: 45.4 %
Platelets: 215 10*3/uL (ref 140–400)
RBC: 4.03 10*6/uL (ref 3.80–5.10)
RDW: 12.2 % (ref 11.0–15.0)
Total Lymphocyte: 44.7 %
WBC mixed population: 316 cells/uL (ref 200–950)
WBC: 3.9 10*3/uL (ref 3.8–10.8)

## 2018-01-17 LAB — TSH: TSH: 1.12 m[IU]/L

## 2018-01-17 LAB — VITAMIN D 25 HYDROXY (VIT D DEFICIENCY, FRACTURES): VIT D 25 HYDROXY: 28 ng/mL — AB (ref 30–100)

## 2018-01-17 MED ORDER — ZOSTER VAC RECOMB ADJUVANTED 50 MCG/0.5ML IM SUSR: 0.5000 mL | Freq: Once | INTRAMUSCULAR | 0 refills | Status: AC

## 2018-01-17 NOTE — Assessment & Plan Note (Signed)
f/u ENT, continue allergy meds, she is decreasing using of benadryl using allegra, nasal steroid

## 2018-01-31 ENCOUNTER — Encounter: Payer: Self-pay | Admitting: Internal Medicine

## 2018-02-05 ENCOUNTER — Ambulatory Visit: Payer: Self-pay | Admitting: Internal Medicine

## 2018-02-14 ENCOUNTER — Ambulatory Visit (INDEPENDENT_AMBULATORY_CARE_PROVIDER_SITE_OTHER): Payer: BLUE CROSS/BLUE SHIELD | Admitting: Otolaryngology

## 2018-02-14 DIAGNOSIS — H6121 Impacted cerumen, right ear: Secondary | ICD-10-CM

## 2018-02-14 DIAGNOSIS — H6983 Other specified disorders of Eustachian tube, bilateral: Secondary | ICD-10-CM

## 2018-02-14 DIAGNOSIS — R42 Dizziness and giddiness: Secondary | ICD-10-CM

## 2018-03-20 ENCOUNTER — Ambulatory Visit (AMBULATORY_SURGERY_CENTER): Payer: Self-pay | Admitting: *Deleted

## 2018-03-20 ENCOUNTER — Other Ambulatory Visit: Payer: Self-pay

## 2018-03-20 VITALS — Ht 62.0 in | Wt 139.0 lb

## 2018-03-20 DIAGNOSIS — Z86010 Personal history of colonic polyps: Secondary | ICD-10-CM

## 2018-03-20 DIAGNOSIS — Z800 Family history of malignant neoplasm of digestive organs: Secondary | ICD-10-CM

## 2018-03-20 NOTE — Progress Notes (Signed)
Patient denies any allergies to eggs or soy. Patient denies any problems with anesthesia/sedation. Patient denies any oxygen use at home. Patient denies taking any diet/weight loss medications or blood thinners. EMMI education declined by pt.  

## 2018-04-03 ENCOUNTER — Ambulatory Visit (AMBULATORY_SURGERY_CENTER): Payer: BLUE CROSS/BLUE SHIELD | Admitting: Internal Medicine

## 2018-04-03 ENCOUNTER — Telehealth: Payer: Self-pay | Admitting: Internal Medicine

## 2018-04-03 ENCOUNTER — Encounter: Payer: Self-pay | Admitting: Internal Medicine

## 2018-04-03 ENCOUNTER — Other Ambulatory Visit: Payer: Self-pay

## 2018-04-03 VITALS — BP 92/59 | HR 50 | Temp 98.6°F | Resp 12 | Ht 63.0 in | Wt 142.0 lb

## 2018-04-03 DIAGNOSIS — D122 Benign neoplasm of ascending colon: Secondary | ICD-10-CM | POA: Diagnosis not present

## 2018-04-03 DIAGNOSIS — D120 Benign neoplasm of cecum: Secondary | ICD-10-CM

## 2018-04-03 DIAGNOSIS — Z1211 Encounter for screening for malignant neoplasm of colon: Secondary | ICD-10-CM | POA: Diagnosis not present

## 2018-04-03 DIAGNOSIS — Z86010 Personal history of colonic polyps: Secondary | ICD-10-CM

## 2018-04-03 MED ORDER — SODIUM CHLORIDE 0.9 % IV SOLN: 500.0000 mL | Freq: Once | INTRAVENOUS | Status: DC

## 2018-04-03 NOTE — Telephone Encounter (Signed)
Patient states she is having nerve pain in her leg and wants to know can she take Advil or a pain medication. Pt has procedure today 6.5.19 @2PM 

## 2018-04-03 NOTE — Telephone Encounter (Signed)
Returned patient's call and she wants to know if she can take Advil for her sciatic pain.  I advised she could.  All questions were answered.  B.Benitez, CMA

## 2018-04-03 NOTE — Progress Notes (Signed)
Report given to PACU, vss 

## 2018-04-03 NOTE — Progress Notes (Signed)
Called to room to assist during endoscopic procedure.  Patient ID and intended procedure confirmed with present staff. Received instructions for my participation in the procedure from the performing physician.  

## 2018-04-03 NOTE — Op Note (Signed)
[facility] Patient Name: Christina [name] Procedure Date: 04/03/2018 2:06 PM MRN: [id number] Endoscopist: Gatha Mayer , MD Age: 52 Referring MD:  Date of Birth: 1966-09-14 Gender: Female Account #: [id number] Procedure:                Colonoscopy Indications:              Surveillance: Personal history of adenomatous                            polyps on last colonoscopy 3 years ago Medicines:                Propofol per Anesthesia, Monitored Anesthesia Care Procedure:                Pre-Anesthesia Assessment:                           - Prior to the procedure, a History and Physical                            was performed, and patient medications and                            allergies were reviewed. The patient's tolerance of                            previous anesthesia was also reviewed. The risks                            and benefits of the procedure and the sedation                            options and risks were discussed with the patient.                            All questions were answered, and informed consent                            was obtained. Prior Anticoagulants: The patient has                            taken no previous anticoagulant or antiplatelet                            agents. ASA Grade Assessment: II - A patient with                            mild systemic disease. After reviewing the risks                            and benefits, the patient was deemed in                            satisfactory condition to undergo the procedure.  After obtaining informed consent, the colonoscope                            was passed under direct vision. Throughout the                            procedure, the patient's blood pressure, pulse, and                            oxygen saturations were monitored continuously. The                            Colonoscope was introduced through the anus and   advanced to the the cecum, identified by                            appendiceal orifice and ileocecal valve. The                            colonoscopy was performed without difficulty. The                            patient tolerated the procedure well. The quality                            of the bowel preparation was good. The ileocecal                            valve, appendiceal orifice, and rectum were                            photographed. Scope In: 2:17:11 PM Scope Out: 2:32:27 PM Scope Withdrawal Time: 0 hours 9 minutes 52 seconds  Total Procedure Duration: 0 hours 15 minutes 16 seconds  Findings:                 The perianal and digital rectal examinations were                            normal.                           Two sessile polyps were found in the ascending                            colon and cecum. The polyps were diminutive in                            size. These polyps were removed with a cold snare.                            Resection and retrieval were complete. Verification                            of patient identification for the specimen was  done. Estimated blood loss was minimal.                           Multiple small and large-mouthed diverticula were                            found in the sigmoid colon.                           The exam was otherwise without abnormality on                            direct and retroflexion views. Complications:            No immediate complications. Estimated Blood Loss:     Estimated blood loss was minimal. Impression:               - Two diminutive polyps in the ascending colon and                            in the cecum, removed with a cold snare. Resected                            and retrieved.                           - Moderate diverticulosis in the sigmoid colon.                           - The examination was otherwise normal on direct                            and  retroflexion views.                           - Personal history of colonic polyps. 2016 - 20 mm                            TA, small TA and small ssp no dysplasia Recommendation:           - Patient has a contact number available for                            emergencies. The signs and symptoms of potential                            delayed complications were discussed with the                            patient. Return to normal activities tomorrow.                            Written discharge instructions were provided to the                            patient.                           -  Continue present medications.                           - Resume previous diet.                           - Repeat colonoscopy is recommended for                            surveillance. The colonoscopy date will be                            determined after pathology results from today's                            exam become available for review. Gatha Mayer, MD 04/03/2018 2:37:44 PM This report has been signed electronically.

## 2018-04-03 NOTE — Patient Instructions (Addendum)
I found and removed 2 small polyps today - I think it will be 5 years before next routine colonoscopy.  I will let you know pathology results and when to have another routine colonoscopy by mail and/or My Chart.  You still have diverticulosis - thickened muscle rings and pouches in the colon wall. Please read the handout about this condition.  I appreciate the opportunity to care for you. Gatha Mayer, MD, [facility]  *handout given on polyps and diverticulosis  YOU HAD AN ENDOSCOPIC PROCEDURE TODAY AT [place]:   Refer to the procedure report that was given to you for any specific questions about what was found during the examination.  If the procedure report does not answer your questions, please call your gastroenterologist to clarify.  If you requested that your care partner not be given the details of your procedure findings, then the procedure report has been included in a sealed envelope for you to review at your convenience later.  YOU SHOULD EXPECT: Some feelings of bloating in the abdomen. Passage of more gas than usual.  Walking can help get rid of the air that was put into your GI tract during the procedure and reduce the bloating. If you had a lower endoscopy (such as a colonoscopy or flexible sigmoidoscopy) you may notice spotting of blood in your stool or on the toilet paper. If you underwent a bowel prep for your procedure, you may not have a normal bowel movement for a few days.  Please Note:  You might notice some irritation and congestion in your nose or some drainage.  This is from the oxygen used during your procedure.  There is no need for concern and it should clear up in a day or so.  SYMPTOMS TO REPORT IMMEDIATELY:   Following lower endoscopy (colonoscopy or flexible sigmoidoscopy):  Excessive amounts of blood in the stool  Significant tenderness or worsening of abdominal pains  Swelling of the abdomen that is new, acute  Fever of 100F or  higher  For urgent or emergent issues, a gastroenterologist can be reached at any hour by calling [phone].   DIET:  We do recommend a small meal at first, but then you may proceed to your regular diet.  Drink plenty of fluids but you should avoid alcoholic beverages for 24 hours.  ACTIVITY:  You should plan to take it easy for the rest of today and you should NOT DRIVE or use heavy machinery until tomorrow (because of the sedation medicines used during the test).    FOLLOW UP: Our staff will call the number listed on your records the next business day following your procedure to check on you and address any questions or concerns that you may have regarding the information given to you following your procedure. If we do not reach you, we will leave a message.  However, if you are feeling well and you are not experiencing any problems, there is no need to return our call.  We will assume that you have returned to your regular daily activities without incident.  If any biopsies were taken you will be contacted by phone or by letter within the next 1-3 weeks.  Please call us at [phone] if you have not heard about the biopsies in 3 weeks.    SIGNATURES/CONFIDENTIALITY: You and/or your care partner have signed paperwork which will be entered into your electronic medical record.  These signatures attest to the fact that that the information  above on your After Visit Summary has been reviewed and is understood.  Full responsibility of the confidentiality of this discharge information lies with you and/or your care-partner. 

## 2018-04-04 ENCOUNTER — Telehealth: Payer: Self-pay

## 2018-04-04 NOTE — Telephone Encounter (Signed)
  Follow up Call-  Call back number 04/03/2018  Post procedure Call Back phone  # [phone]  Permission to leave phone message Yes  Some recent data might be hidden     Patient questions:  Do you have a fever, pain , or abdominal swelling? No. Pain Score  0 *  Have you tolerated food without any problems? Yes.    Have you been able to return to your normal activities? Yes.    Do you have any questions about your discharge instructions: Diet   No. Medications  No. Follow up visit  No.  Do you have questions or concerns about your Care? No.  Actions: * If pain score is 4 or above: No action needed, pain <4.

## 2018-04-10 ENCOUNTER — Encounter: Payer: Self-pay | Admitting: Internal Medicine

## 2018-04-10 DIAGNOSIS — Z86010 Personal history of colonic polyps: Secondary | ICD-10-CM

## 2018-04-10 NOTE — Progress Notes (Signed)
2 small adenomas Recall 2024

## 2018-06-12 DIAGNOSIS — H0100A Unspecified blepharitis right eye, upper and lower eyelids: Secondary | ICD-10-CM | POA: Diagnosis not present

## 2018-11-11 ENCOUNTER — Other Ambulatory Visit: Payer: Self-pay

## 2018-11-11 ENCOUNTER — Encounter: Payer: Self-pay | Admitting: Family Medicine

## 2018-11-11 ENCOUNTER — Ambulatory Visit (INDEPENDENT_AMBULATORY_CARE_PROVIDER_SITE_OTHER): Payer: BLUE CROSS/BLUE SHIELD | Admitting: Family Medicine

## 2018-11-11 VITALS — BP 110/68 | HR 60 | Temp 98.4°F | Resp 14 | Ht 63.0 in | Wt 141.0 lb

## 2018-11-11 DIAGNOSIS — F418 Other specified anxiety disorders: Secondary | ICD-10-CM | POA: Diagnosis not present

## 2018-11-11 MED ORDER — ESCITALOPRAM OXALATE 5 MG PO TABS: 5.0000 mg | ORAL_TABLET | Freq: Every day | ORAL | 2 refills | Status: DC

## 2018-11-11 MED ORDER — ALPRAZOLAM 0.25 MG PO TABS: 0.2500 mg | ORAL_TABLET | Freq: Two times a day (BID) | ORAL | 1 refills | Status: DC | PRN

## 2018-11-11 NOTE — Assessment & Plan Note (Signed)
Trial of lexapro 5mg  at bedtime [place] with xanax 0.25mg  BID prn No SI Discussed volunteering or finding a hobby for herself Will f/u in a few weeks see how she is doing She is to call for any concerns with medications

## 2018-11-11 NOTE — Progress Notes (Signed)
Subjective:    Patient ID: Christina [name], female    DOB: June 07, 1966, 53 y.o.   MRN: [id number]  Patient presents for Anxiety (has had for a while, but has worsened in the last month- could also e hormonal)  Patient here with increased anxiety symptoms and some depression for the past few months.  States that she just feels overwhelmed with everything on her plate.  She is state her mother but she has a 44 something-year-old daughter with cerebral palsy who requires 24-hour care she also has a 32-year-old son.  Her husband was recently diagnosed with epilepsy here which has also been on her mind.  She states that she worries about the future if something happens with her husband who is 16 years her senior she does not work outside of the home she worries about their financial stability.  States that they make some choices to buy a beach home recently and now she is worried about having the cleaned at home and the financial stability if something were to happen with declining of his health.  She also states that she is perimenopausal.  Was not sure if some of her hormones are contributing to this as well but she declines having severe hot flashes.  She does have decreased libido over the past couple months.  She finds herself quite tearful and weeping especially when she is alone she tries to keep it together for the sake of her family.  In the past she was given alprazolam 0.5 mg and Ambien 5 mg for sleep.  The Ambien however did not help this much.  The alprazolam did help calm her down however she still has half a bottle and this was prescribed in 2016.  She states that her mother also has depression is on Lexapro want to know if this worked well for her she did look up the medication.   Review Of Systems:  GEN- denies fatigue, fever, weight loss,weakness, recent illness HEENT- denies eye drainage, change in vision, nasal discharge, CVS- denies chest pain, palpitations RESP- denies SOB, cough,  wheeze ABD- denies N/V, change in stools, abd pain GU- denies dysuria, hematuria, dribbling, incontinence MSK- denies joint pain, muscle aches, injury Neuro- denies headache, dizziness, syncope, seizure activity       Objective:    BP 110/68   Pulse 60   Temp 98.4 F (36.9 C) (Oral)   Resp 14   Ht 5\' 3"  (1.6 m)   Wt 141 lb (64 kg)   SpO2 98%   BMI 24.98 kg/m  GEN- NAD, alert and oriented x3 HEENT- PERRL, EOMI, non injected sclera, pink conjunctiva, MMM, oropharynx clear Neck- Supple, no thyromegaly CVS- RRR, no murmur RESP-CTAB Psych- depressed appearing, Tearful, not anxious, no SI        Assessment & Plan:      Problem List Items Addressed This Visit      Unprioritized   Depression with anxiety - Primary    Trial of lexapro 5mg  at bedtime [place] with xanax 0.25mg  BID prn No SI Discussed volunteering or finding a hobby for herself Will f/u in a few weeks see how she is doing She is to call for any concerns with medications      Relevant Medications   ALPRAZolam (XANAX) 0.25 MG tablet   escitalopram (LEXAPRO) 5 MG tablet      Note: This dictation was prepared with Dragon dictation along with smaller phrase technology. Any transcriptional errors that result from this process are unintentional.

## 2018-11-11 NOTE — Patient Instructions (Signed)
Start lexapro 5mg   F/U 1 month

## 2018-11-13 DIAGNOSIS — Z6824 Body mass index (BMI) 24.0-24.9, adult: Secondary | ICD-10-CM | POA: Diagnosis not present

## 2018-11-13 DIAGNOSIS — Z01419 Encounter for gynecological examination (general) (routine) without abnormal findings: Secondary | ICD-10-CM | POA: Diagnosis not present

## 2018-11-13 DIAGNOSIS — Z124 Encounter for screening for malignant neoplasm of cervix: Secondary | ICD-10-CM | POA: Diagnosis not present

## 2018-11-13 DIAGNOSIS — Z1231 Encounter for screening mammogram for malignant neoplasm of breast: Secondary | ICD-10-CM | POA: Diagnosis not present

## 2018-12-20 ENCOUNTER — Ambulatory Visit: Payer: Self-pay | Admitting: Family Medicine

## 2018-12-23 ENCOUNTER — Encounter: Payer: Self-pay | Admitting: Family Medicine

## 2018-12-23 ENCOUNTER — Other Ambulatory Visit: Payer: Self-pay

## 2018-12-23 ENCOUNTER — Ambulatory Visit (INDEPENDENT_AMBULATORY_CARE_PROVIDER_SITE_OTHER): Payer: BLUE CROSS/BLUE SHIELD | Admitting: Family Medicine

## 2018-12-23 VITALS — BP 124/64 | HR 58 | Temp 98.4°F | Resp 14 | Ht 63.0 in | Wt 141.0 lb

## 2018-12-23 DIAGNOSIS — R0981 Nasal congestion: Secondary | ICD-10-CM | POA: Diagnosis not present

## 2018-12-23 DIAGNOSIS — F418 Other specified anxiety disorders: Secondary | ICD-10-CM | POA: Diagnosis not present

## 2018-12-23 NOTE — Progress Notes (Signed)
Subjective:    Patient ID: Christina [name], female    DOB: June 09, 1966, 53 y.o.   MRN: [id number]  Patient presents for Follow-up (medication review) and Ear Pressure (x few days- has some nasal congestion and B ear pressure)  Patient here to follow-up medications.  She was seen on December 13 with anxiety and depression symptoms.  She was started on Lexapro 5 mg at bedtime with a bridge of alprazolam to help with sleep See previous note she is a 72 year old daughter with cerebral palsy and 6-year-old son, her husband was recently diagnosed with epilepsy.  Other symptoms she expressed were decreased libido, insomnia.  She not have any significant weight changes.  We discussed volunteering getting herself out of the house to do things for herself. PHQ 9 score was 15 at that visit.  Today overall much improved states that she could tell she is not tearful anymore the past 7 to 10 days the medicine has really kicked in.  She feels more calm.  She is now thinking about volunteering either with the children at the local elementary school or somewhere where she gets so which is a hobby of hers.  She is also exercising regularly now which helps with stress levels.  Initially she felt a little woozy with the medication but that has worn off.  There is been no change her appetite.  She has chronic sleep issues still occasionally takes the Benadryl she has not tried the alprazolam for sleep states that she only took about 2 pills of a bottle for the middle the day when she felt anxious.   Sinus pressure and ear pain. Has chronic tinnitus. Has felt her equilibrium, no fever, no cough, no post nasal drip   Has not used her allegra     Review Of Systems:  GEN- denies fatigue, fever, weight loss,weakness, recent illness HEENT- denies eye drainage, change in vision, nasal discharge, CVS- denies chest pain, palpitations RESP- denies SOB, cough, wheeze ABD- denies N/V, change in stools, abd pain Neuro- denies  headache, dizziness, syncope, seizure activity       Objective:    BP 124/64   Pulse (!) 58   Temp 98.4 F (36.9 C) (Oral)   Resp 14   Ht 5\' 3"  (1.6 m)   Wt 141 lb (64 kg)   SpO2 98%   BMI 24.98 kg/m  GEN- NAD, alert and oriented x3 Psych-  GEN- NAD, alert and oriented x3 HEENT- PERRL, EOMI, non injected sclera, pink conjunctiva, MMM, oropharynx clear, TM clear bilat no effusion, no  maxillary sinus tenderness, nares clear, mild nasal congestion  Neck- Supple, no LAD CVS- RRR, no murmur RESP-CTAB Psych- normal affect and mood  EXT- No edema Pulses- Radial 2+         Assessment & Plan:      Problem List Items Addressed This Visit      Unprioritized   Depression with anxiety - Primary    Improved symptoms and scores Continue lexapro at 5mg  Can use xanax for sleep as needed She declines therapy Agree with hobby and volunteering and the exercise Re-acess need in 3 months        Other Visit Diagnoses    Nasal congestion       Restart allegra, if symptoms progress, will call in amoxicillin      Note: This dictation was prepared with Dragon dictation along with smaller phrase technology. Any transcriptional errors that result from this process are unintentional.

## 2018-12-23 NOTE — Patient Instructions (Signed)
F/U 3 months for Physical- Fasting

## 2018-12-23 NOTE — Assessment & Plan Note (Addendum)
Improved symptoms and scores Continue lexapro at 5mg  Can use xanax for sleep as needed She declines therapy Agree with hobby and volunteering and the exercise Re-acess need in 3 months

## 2019-02-01 ENCOUNTER — Other Ambulatory Visit: Payer: Self-pay | Admitting: Family Medicine

## 2019-02-07 ENCOUNTER — Telehealth: Payer: BLUE CROSS/BLUE SHIELD | Admitting: Nurse Practitioner

## 2019-02-07 DIAGNOSIS — Z20822 Contact with and (suspected) exposure to covid-19: Secondary | ICD-10-CM

## 2019-02-07 DIAGNOSIS — R6889 Other general symptoms and signs: Principal | ICD-10-CM

## 2019-02-07 NOTE — Progress Notes (Signed)
E-Visit for Corona Virus Screening  Based on your current symptoms, you may very well have the virus, however your symptoms are mild. Currently, not all patients are being tested. If the symptoms are mild and there is not a known exposure, performing the test is not indicated.  * if you do have covid then you have already exposed your daughter unfortunately. The ER  At [facility] would have to decide if you should be tested.  Coronavirus disease 2019 (COVID-19) is a respiratory illness that can spread from person to person. The virus that causes COVID-19 is a new virus that was first identified in the country of [place] but is now found in multiple other countries and has spread to the [place].  Symptoms associated with the virus are mild to severe fever, cough, and shortness of breath. There is currently no vaccine to protect against COVID-19, and there is no specific antiviral treatment for the virus.   To be considered HIGH RISK for Coronavirus (COVID-19), you have to meet the following criteria:  . Traveled to [place], [place], [place], [place] or [place]; or in the [place] to [place], [place], [place], or [place]; and have fever, cough, and shortness of breath within the last 2 weeks of travel OR  . Been in close contact with a person diagnosed with COVID-19 within the last 2 weeks and have fever, cough, and shortness of breath  . IF YOU DO NOT MEET THESE CRITERIA, YOU ARE CONSIDERED LOW RISK FOR COVID-19.   It is vitally important that if you feel that you have an infection such as this virus or any other virus that you stay home and away from places where you may spread it to others.  You should self-quarantine for 14 days if you have symptoms that could potentially be coronavirus and avoid contact with people age 44 and older.   You can use medication such as delsym or mucinex OTC for cough  You may also take acetaminophen (Tylenol) as needed for fever.   Reduce your risk  of any infection by using the same precautions used for avoiding the common cold or flu:  Marland Kitchen Wash your hands often with soap and warm water for at least 20 seconds.  If soap and water are not readily available, use an alcohol-based hand sanitizer with at least 60% alcohol.  . If coughing or sneezing, cover your mouth and nose by coughing or sneezing into the elbow areas of your shirt or coat, into a tissue or into your sleeve (not your hands). . Avoid shaking hands with others and consider head nods or verbal greetings only. . Avoid touching your eyes, nose, or mouth with unwashed hands.  . Avoid close contact with people who are sick. . Avoid places or events with large numbers of people in one location, like concerts or sporting events. . Carefully consider travel plans you have or are making. . If you are planning any travel outside or inside the [place], visit the CDC's Travelers' Health webpage for the latest health notices. . If you have some symptoms but not all symptoms, continue to monitor at home and seek medical attention if your symptoms worsen. . If you are having a medical emergency, call 911.  HOME CARE . Only take medications as instructed by your medical team. . Drink plenty of fluids and get plenty of rest. . A steam or ultrasonic humidifier can help if you have congestion.   GET HELP RIGHT AWAY  IF: . You develop worsening fever. . You become short of breath . You cough up blood. . Your symptoms become more severe MAKE SURE YOU   Understand these instructions.  Will watch your condition.  Will get help right away if you are not doing well or get worse.  Your e-visit answers were reviewed by a board certified advanced clinical practitioner to complete your personal care plan.  Depending on the condition, your plan could have included both over the counter or prescription medications.  If there is a problem please reply once you have received a response from your  provider. Your safety is important to [place].  If you have drug allergies check your prescription carefully.    You can use MyChart to ask questions about today's visit, request a non-urgent call back, or ask for a work or school excuse for 24 hours related to this e-Visit. If it has been greater than 24 hours you will need to follow up with your provider, or enter a new e-Visit to address those concerns. You will get an e-mail in the next two days asking about your experience.  I hope that your e-visit has been valuable and will speed your recovery. Thank you for using e-visits.   5 minutes spent reviewing and documenting in chart.

## 2019-02-11 ENCOUNTER — Ambulatory Visit (INDEPENDENT_AMBULATORY_CARE_PROVIDER_SITE_OTHER): Payer: BLUE CROSS/BLUE SHIELD | Admitting: Family Medicine

## 2019-02-11 ENCOUNTER — Encounter: Payer: Self-pay | Admitting: Family Medicine

## 2019-02-11 ENCOUNTER — Other Ambulatory Visit: Payer: Self-pay

## 2019-02-11 DIAGNOSIS — J0100 Acute maxillary sinusitis, unspecified: Secondary | ICD-10-CM

## 2019-02-11 MED ORDER — AMOXICILLIN 875 MG PO TABS: 875.0000 mg | ORAL_TABLET | Freq: Two times a day (BID) | ORAL | 0 refills | Status: DC

## 2019-02-11 NOTE — Progress Notes (Signed)
Virtual Visit via Telephone Note  I connected with Christina [name] on 02/11/19 at 11:41am  by telephone and verified that I am speaking with the correct person using two identifiers.   Pt location: in home   Physician location: in office, [employer] Family, Vic Blackbird MD I discussed the limitations, risks, security and privacy concerns of performing an evaluation and management service by telephone and the availability of in person appointments. I also discussed with the patient that there may be a patient responsible charge related to this service. The patient expressed understanding and agreed to proceed.   History of Present Illness: Last Thursday/Friday had low grade fever, sore throat, pressure in ears with some fatigue. Saturday fever broke and sore throat improved.Sunday felt better, Monday had sinus headache over eyes and sinus pressure/ fluid in ears which has persisted.  No cough Has been using allegra which has helped some also took a few doses of bendaryl at night. No known sick contacts. GI symptoms    Observations/Objective:  Unable to visualize Assessment and Plan: Acute sinusitis -patient with more sinusitis symptoms pressure headache low-grade fever.  She has not developed any cough or significant body aches or flulike symptoms otherwise.  She has been within her home caring for her daughter and her son.  No other known sick contacts so likely still low risk for COVID.  We will treat for sinusitis with amoxicillin will use Sudafed to decongest she can use nasal saline or Flonase.  I did discuss that we will still be on the cautious and if she should still stay quarantine within her home until her symptoms have resolved.  Did give her red flags if she does develop cough shortness of breath to alert [place]    Follow Up Instructions: As needed   I discussed the assessment and treatment plan with the patient. The patient was provided an opportunity to ask questions and all were  answered. The patient agreed with the plan and demonstrated an understanding of the instructions.   The patient was advised to call back or seek an in-person evaluation if the symptoms worsen or if the condition fails to improve as anticipated.  I provided 10 minutes of non-face-to-face time during this encounter.  11:51  Vic Blackbird, MD

## 2019-02-13 DIAGNOSIS — M5136 Other intervertebral disc degeneration, lumbar region: Secondary | ICD-10-CM | POA: Diagnosis not present

## 2019-03-26 ENCOUNTER — Ambulatory Visit (INDEPENDENT_AMBULATORY_CARE_PROVIDER_SITE_OTHER): Payer: BLUE CROSS/BLUE SHIELD | Admitting: Family Medicine

## 2019-03-26 ENCOUNTER — Other Ambulatory Visit: Payer: Self-pay

## 2019-03-26 VITALS — BP 110/64 | HR 66 | Temp 98.4°F | Resp 18 | Ht 63.3 in | Wt 136.8 lb

## 2019-03-26 DIAGNOSIS — Z0001 Encounter for general adult medical examination with abnormal findings: Secondary | ICD-10-CM

## 2019-03-26 DIAGNOSIS — E559 Vitamin D deficiency, unspecified: Secondary | ICD-10-CM

## 2019-03-26 DIAGNOSIS — Z0000 Encounter for general adult medical examination without abnormal findings: Secondary | ICD-10-CM

## 2019-03-26 DIAGNOSIS — F418 Other specified anxiety disorders: Secondary | ICD-10-CM | POA: Diagnosis not present

## 2019-03-26 MED ORDER — ALPRAZOLAM 0.25 MG PO TABS: 0.2500 mg | ORAL_TABLET | Freq: Two times a day (BID) | ORAL | 1 refills | Status: DC | PRN

## 2019-03-26 NOTE — Progress Notes (Signed)
   Subjective:    Patient ID: Christina [name], female    DOB: 21-Aug-1966, 53 y.o.   MRN: [id number]  Patient presents for Annual Exam  Pt here for CPE, medications reviewed  Taking lexapro 5mg xanax at bedtime without difficulty, feels it keeps her mood stable, feels well   PAP Smear done with past year / Mammogram with Dr. Ouida Sills     Due for shingrix , TDAP UTD    Due for fasting labs   Stays active, walks, rides bikes with family   Review Of Systems:  GEN- denies fatigue, fever, weight loss,weakness, recent illness HEENT- denies eye drainage, change in vision, nasal discharge, CVS- denies chest pain, palpitations RESP- denies SOB, cough, wheeze ABD- denies N/V, change in stools, abd pain GU- denies dysuria, hematuria, dribbling, incontinence MSK- denies joint pain, muscle aches, injury Neuro- denies headache, dizziness, syncope, seizure activity       Objective:    BP 110/64   Pulse 66   Temp 98.4 F (36.9 C)   Resp 18   Ht 5' 3.3" (1.608 m)   Wt 136 lb 12.8 oz (62.1 kg)   SpO2 96%   BMI 24.00 kg/m  GEN- NAD, alert and oriented x3 HEENT- PERRL, EOMI, non injected sclera, pink conjunctiva, MMM, oropharynx clear Neck- Supple, no thyromegaly CVS- RRR, no murmur RESP-CTAB ABD-NABS,soft,NT,ND Psych- normal affect and mood EXT- No edema Pulses- Radial, DP- 2+       PHQ 9 neg/ fall neg/ audit C neg Assessment & Plan:      Problem List Items Addressed This Visit      Unprioritized   Depression with anxiety    Doing well on lexapro, no changes      Relevant Medications   ALPRAZolam (XANAX) 0.25 MG tablet    Other Visit Diagnoses    Routine general medical examination at a health care facility    -  Primary   CPE done, shingrix to pharmacy, return for fasting labs, obtain GYN Mammo/PAP   Relevant Orders   CBC with Differential/Platelet   Comprehensive metabolic panel   Lipid panel   Vitamin D deficiency       Relevant Orders   Vitamin D, 25-hydroxy       Note: This dictation was prepared with Dragon dictation along with smaller phrase technology. Any transcriptional errors that result from this process are unintentional.

## 2019-03-26 NOTE — Patient Instructions (Addendum)
F/U 6 months  Return for fasting labs Shingles vaccine sent to pharmacy

## 2019-03-27 ENCOUNTER — Other Ambulatory Visit: Payer: Self-pay

## 2019-03-27 ENCOUNTER — Encounter: Payer: Self-pay | Admitting: Family Medicine

## 2019-03-27 DIAGNOSIS — Z23 Encounter for immunization: Secondary | ICD-10-CM

## 2019-03-27 MED ORDER — ZOSTER VAC RECOMB ADJUVANTED 50 MCG/0.5ML IM SUSR: 0.5000 mL | Freq: Once | INTRAMUSCULAR | 0 refills | Status: AC

## 2019-03-27 NOTE — Assessment & Plan Note (Signed)
Doing well on lexapro, no changes

## 2019-04-09 ENCOUNTER — Other Ambulatory Visit: Payer: Self-pay

## 2019-04-09 ENCOUNTER — Other Ambulatory Visit: Payer: BC Managed Care – PPO

## 2019-04-09 DIAGNOSIS — Z0000 Encounter for general adult medical examination without abnormal findings: Secondary | ICD-10-CM

## 2019-04-09 DIAGNOSIS — E559 Vitamin D deficiency, unspecified: Secondary | ICD-10-CM | POA: Diagnosis not present

## 2019-04-10 LAB — CBC WITH DIFFERENTIAL/PLATELET
Absolute Monocytes: 385 cells/uL (ref 200–950)
Basophils Absolute: 21 cells/uL (ref 0–200)
Basophils Relative: 0.6 %
Eosinophils Absolute: 60 cells/uL (ref 15–500)
Eosinophils Relative: 1.7 %
HCT: 38.3 % (ref 35.0–45.0)
Hemoglobin: 12.9 g/dL (ref 11.7–15.5)
Lymphs Abs: 1544 cells/uL (ref 850–3900)
MCH: 32.3 pg (ref 27.0–33.0)
MCHC: 33.7 g/dL (ref 32.0–36.0)
MCV: 96 fL (ref 80.0–100.0)
MPV: 11.5 fL (ref 7.5–12.5)
Monocytes Relative: 11 %
Neutro Abs: 1491 cells/uL — ABNORMAL LOW (ref 1500–7800)
Neutrophils Relative %: 42.6 %
Platelets: 208 10*3/uL (ref 140–400)
RBC: 3.99 10*6/uL (ref 3.80–5.10)
RDW: 12.3 % (ref 11.0–15.0)
Total Lymphocyte: 44.1 %
WBC: 3.5 10*3/uL — ABNORMAL LOW (ref 3.8–10.8)

## 2019-04-10 LAB — LIPID PANEL
Cholesterol: 199 mg/dL (ref <200)
HDL: 85 mg/dL (ref >=50)
LDL Cholesterol (Calc): 98 mg/dL (calc)
Non-HDL Cholesterol (Calc): 114 mg/dL (calc) (ref <130)
Total CHOL/HDL Ratio: 2.3 (calc) (ref <5.0)
Triglycerides: 70 mg/dL (ref <150)

## 2019-04-10 LAB — COMPREHENSIVE METABOLIC PANEL
AG Ratio: 2.2 (calc) (ref 1.0–2.5)
ALT: 16 U/L (ref 6–29)
AST: 20 U/L (ref 10–35)
Albumin: 4.3 g/dL (ref 3.6–5.1)
Alkaline phosphatase (APISO): 52 U/L (ref 37–153)
BUN: 10 mg/dL (ref 7–25)
CO2: 27 mmol/L (ref 20–32)
Calcium: 9.3 mg/dL (ref 8.6–10.4)
Chloride: 106 mmol/L (ref 98–110)
Creat: 0.75 mg/dL (ref 0.50–1.05)
Globulin: 2 g/dL (calc) (ref 1.9–3.7)
Glucose, Bld: 85 mg/dL (ref 65–99)
Potassium: 4.1 mmol/L (ref 3.5–5.3)
Sodium: 141 mmol/L (ref 135–146)
Total Bilirubin: 0.6 mg/dL (ref 0.2–1.2)
Total Protein: 6.3 g/dL (ref 6.1–8.1)

## 2019-04-10 LAB — VITAMIN D 25 HYDROXY (VIT D DEFICIENCY, FRACTURES): Vit D, 25-Hydroxy: 36 ng/mL (ref 30–100)

## 2019-05-26 ENCOUNTER — Other Ambulatory Visit: Payer: Self-pay | Admitting: Family Medicine

## 2019-06-10 DIAGNOSIS — Z20828 Contact with and (suspected) exposure to other viral communicable diseases: Secondary | ICD-10-CM | POA: Diagnosis not present

## 2019-08-01 ENCOUNTER — Other Ambulatory Visit: Payer: Self-pay

## 2019-08-01 ENCOUNTER — Emergency Department (HOSPITAL_COMMUNITY)
Admission: EM | Admit: 2019-08-01 | Discharge: 2019-08-01 | Disposition: A | Discharge status: Home / Self Care | Payer: BC Managed Care – PPO | Attending: Emergency Medicine | Admitting: Emergency Medicine

## 2019-08-01 ENCOUNTER — Encounter (HOSPITAL_COMMUNITY): Payer: Self-pay | Admitting: Emergency Medicine

## 2019-08-01 DIAGNOSIS — Y999 Unspecified external cause status: Secondary | ICD-10-CM | POA: Diagnosis not present

## 2019-08-01 DIAGNOSIS — Z87891 Personal history of nicotine dependence: Secondary | ICD-10-CM | POA: Diagnosis not present

## 2019-08-01 DIAGNOSIS — Y9355 Activity, bike riding: Secondary | ICD-10-CM | POA: Diagnosis not present

## 2019-08-01 DIAGNOSIS — S50311A Abrasion of right elbow, initial encounter: Secondary | ICD-10-CM

## 2019-08-01 DIAGNOSIS — S01111A Laceration without foreign body of right eyelid and periocular area, initial encounter: Secondary | ICD-10-CM | POA: Insufficient documentation

## 2019-08-01 DIAGNOSIS — Z79899 Other long term (current) drug therapy: Secondary | ICD-10-CM | POA: Insufficient documentation

## 2019-08-01 DIAGNOSIS — Y929 Unspecified place or not applicable: Secondary | ICD-10-CM | POA: Insufficient documentation

## 2019-08-01 MED ORDER — IBUPROFEN 800 MG PO TABS
800.0000 mg | ORAL_TABLET | Freq: Once | ORAL | Status: AC
  Administered 2019-08-01: 800 mg via ORAL
  Filled 2019-08-01: qty 1

## 2019-08-01 MED ORDER — LIDOCAINE HCL (PF) 1 % IJ SOLN
2.0000 mL | Freq: Once | INTRAMUSCULAR | Status: AC
  Administered 2019-08-01: 2 mL via INTRADERMAL
  Filled 2019-08-01: qty 2

## 2019-08-01 MED ORDER — POVIDONE-IODINE 10 % EX SOLN
CUTANEOUS | Status: DC | PRN
  Administered 2019-08-01: 21:00:00 via TOPICAL
  Filled 2019-08-01: qty 15
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
## 2019-08-01 NOTE — Discharge Instructions (Addendum)
Clean the wounds with mild soap and water.  Keep them bandaged for the first few days.  Sutures out in 5 to 7 days.  You may apply ice packs on and off to help with swelling and bruising.  Ibuprofen for pain.  As discussed, return to the ER if you develop any worsening symptoms such as severe sudden onset of headache, vomiting, visual changes numbness or weakness.
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
## 2019-08-07 ENCOUNTER — Ambulatory Visit (INDEPENDENT_AMBULATORY_CARE_PROVIDER_SITE_OTHER): Payer: BC Managed Care – PPO | Admitting: Family Medicine

## 2019-08-07 ENCOUNTER — Other Ambulatory Visit: Payer: Self-pay

## 2019-08-07 ENCOUNTER — Encounter: Payer: Self-pay | Admitting: Family Medicine

## 2019-08-07 VITALS — BP 112/66 | HR 66 | Temp 97.9°F | Resp 14 | Ht 62.5 in | Wt 129.0 lb

## 2019-08-07 DIAGNOSIS — Z4802 Encounter for removal of sutures: Secondary | ICD-10-CM

## 2019-08-07 DIAGNOSIS — M7551 Bursitis of right shoulder: Secondary | ICD-10-CM

## 2019-08-07 MED ORDER — HYDROCODONE-ACETAMINOPHEN 5-325 MG PO TABS: 1.0000 | ORAL_TABLET | Freq: Four times a day (QID) | ORAL | 0 refills | Status: DC | PRN
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
## 2019-08-28 DIAGNOSIS — R0781 Pleurodynia: Secondary | ICD-10-CM | POA: Diagnosis not present

## 2019-10-20 ENCOUNTER — Other Ambulatory Visit: Payer: Self-pay | Admitting: Family Medicine

## 2019-10-20 NOTE — Telephone Encounter (Signed)
Ok to refill??  Last office visit/ refill 03/26/2019, #1 refills.

## 2019-11-17 ENCOUNTER — Encounter: Payer: Self-pay | Admitting: Family Medicine

## 2019-11-17 ENCOUNTER — Other Ambulatory Visit: Payer: Self-pay

## 2019-11-17 ENCOUNTER — Ambulatory Visit: Payer: BC Managed Care – PPO | Admitting: Family Medicine

## 2019-11-17 VITALS — BP 110/70 | HR 58 | Temp 97.9°F | Resp 16 | Ht 62.5 in | Wt 138.0 lb

## 2019-11-17 DIAGNOSIS — M12811 Other specific arthropathies, not elsewhere classified, right shoulder: Secondary | ICD-10-CM

## 2019-11-17 DIAGNOSIS — M75101 Unspecified rotator cuff tear or rupture of right shoulder, not specified as traumatic: Secondary | ICD-10-CM | POA: Diagnosis not present
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
## 2019-11-28 ENCOUNTER — Other Ambulatory Visit: Payer: Self-pay | Admitting: Family Medicine

## 2019-11-28 DIAGNOSIS — M7551 Bursitis of right shoulder: Secondary | ICD-10-CM

## 2019-11-28 DIAGNOSIS — M12811 Other specific arthropathies, not elsewhere classified, right shoulder: Secondary | ICD-10-CM

## 2019-11-28 DIAGNOSIS — M75101 Unspecified rotator cuff tear or rupture of right shoulder, not specified as traumatic: Secondary | ICD-10-CM

## 2019-12-02 DIAGNOSIS — M25511 Pain in right shoulder: Secondary | ICD-10-CM | POA: Diagnosis not present

## 2019-12-25 DIAGNOSIS — Z01419 Encounter for gynecological examination (general) (routine) without abnormal findings: Secondary | ICD-10-CM | POA: Diagnosis not present

## 2019-12-25 DIAGNOSIS — Z124 Encounter for screening for malignant neoplasm of cervix: Secondary | ICD-10-CM | POA: Diagnosis not present

## 2019-12-25 DIAGNOSIS — Z6823 Body mass index (BMI) 23.0-23.9, adult: Secondary | ICD-10-CM | POA: Diagnosis not present

## 2019-12-25 DIAGNOSIS — Z1231 Encounter for screening mammogram for malignant neoplasm of breast: Secondary | ICD-10-CM | POA: Diagnosis not present

## 2020-02-14 ENCOUNTER — Other Ambulatory Visit: Payer: Self-pay | Admitting: Family Medicine

## 2020-03-06 ENCOUNTER — Other Ambulatory Visit: Payer: Self-pay | Admitting: Family Medicine

## 2020-03-08 NOTE — Telephone Encounter (Signed)
Ok to refill??  Last office visit 11/17/2019.  Last refill 10/20/2019, #1 refill.

## 2020-04-26 ENCOUNTER — Other Ambulatory Visit: Payer: Self-pay

## 2020-04-26 ENCOUNTER — Ambulatory Visit: Payer: BC Managed Care – PPO | Admitting: Family Medicine

## 2020-04-26 ENCOUNTER — Encounter: Payer: Self-pay | Admitting: Family Medicine

## 2020-04-26 VITALS — BP 112/64 | HR 62 | Temp 98.1°F | Resp 14 | Ht 62.5 in | Wt 138.0 lb

## 2020-04-26 DIAGNOSIS — F418 Other specified anxiety disorders: Secondary | ICD-10-CM

## 2020-04-26 DIAGNOSIS — H60331 Swimmer's ear, right ear: Secondary | ICD-10-CM | POA: Diagnosis not present

## 2020-04-26 DIAGNOSIS — H6121 Impacted cerumen, right ear: Secondary | ICD-10-CM | POA: Diagnosis not present

## 2020-04-26 MED ORDER — NEOMYCIN-POLYMYXIN-HC 3.5-10000-1 OT SOLN: 3.0000 [drp] | Freq: Three times a day (TID) | OTIC | 0 refills | Status: DC

## 2020-04-26 NOTE — Patient Instructions (Signed)
F/U as needed
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
## 2020-04-27 ENCOUNTER — Encounter: Payer: Self-pay | Admitting: Family Medicine

## 2020-04-27 NOTE — Assessment & Plan Note (Signed)
Recommend she take lexapro every night Can take up to 0.5mg  at bedtime for sleep of xanax D/C use of benadryl every night For libido she will reach out to her GYN, there was some questions about trying hormones  prefer not to D/C lexapro at this time, as symptoms were present before, also currently going through stressful family event at this time

## 2020-05-25 ENCOUNTER — Other Ambulatory Visit: Payer: Self-pay | Admitting: Family Medicine

## 2020-05-25 NOTE — Telephone Encounter (Signed)
Ok to refill??  Last office visit 04/26/2020.  Last refill 03/09/2020, #1 refill.

## 2020-05-27 ENCOUNTER — Other Ambulatory Visit: Payer: Self-pay | Admitting: Obstetrics and Gynecology

## 2020-05-27 DIAGNOSIS — N644 Mastodynia: Secondary | ICD-10-CM

## 2020-06-04 ENCOUNTER — Other Ambulatory Visit: Payer: BC Managed Care – PPO

## 2020-06-15 ENCOUNTER — Other Ambulatory Visit: Payer: Self-pay

## 2020-06-15 ENCOUNTER — Other Ambulatory Visit: Payer: Self-pay | Admitting: Obstetrics and Gynecology

## 2020-06-15 ENCOUNTER — Ambulatory Visit
Admission: RE | Admit: 2020-06-15 | Discharge: 2020-06-15 | Disposition: A | Discharge status: Home / Self Care | Payer: BC Managed Care – PPO | Source: Ambulatory Visit | Attending: Obstetrics and Gynecology | Admitting: Obstetrics and Gynecology

## 2020-06-15 DIAGNOSIS — N644 Mastodynia: Secondary | ICD-10-CM | POA: Diagnosis not present

## 2020-06-15 DIAGNOSIS — N6489 Other specified disorders of breast: Secondary | ICD-10-CM

## 2020-06-22 ENCOUNTER — Other Ambulatory Visit: Payer: Self-pay

## 2020-06-22 ENCOUNTER — Ambulatory Visit
Admission: RE | Admit: 2020-06-22 | Discharge: 2020-06-22 | Disposition: A | Discharge status: Home / Self Care | Payer: BC Managed Care – PPO | Source: Ambulatory Visit | Attending: Obstetrics and Gynecology | Admitting: Obstetrics and Gynecology

## 2020-06-22 DIAGNOSIS — N6021 Fibroadenosis of right breast: Secondary | ICD-10-CM | POA: Diagnosis not present

## 2020-06-22 DIAGNOSIS — R928 Other abnormal and inconclusive findings on diagnostic imaging of breast: Secondary | ICD-10-CM | POA: Diagnosis not present

## 2020-06-22 DIAGNOSIS — N6489 Other specified disorders of breast: Secondary | ICD-10-CM

## 2020-07-14 DIAGNOSIS — M25511 Pain in right shoulder: Secondary | ICD-10-CM | POA: Diagnosis not present

## 2020-07-19 ENCOUNTER — Ambulatory Visit (INDEPENDENT_AMBULATORY_CARE_PROVIDER_SITE_OTHER): Payer: BC Managed Care – PPO | Admitting: Family Medicine

## 2020-07-19 ENCOUNTER — Other Ambulatory Visit: Payer: Self-pay

## 2020-07-19 VITALS — BP 112/62 | HR 66 | Temp 98.4°F | Resp 16 | Ht 62.5 in | Wt 137.0 lb

## 2020-07-19 DIAGNOSIS — Z23 Encounter for immunization: Secondary | ICD-10-CM

## 2020-07-19 DIAGNOSIS — F418 Other specified anxiety disorders: Secondary | ICD-10-CM

## 2020-07-19 DIAGNOSIS — Z0001 Encounter for general adult medical examination with abnormal findings: Secondary | ICD-10-CM

## 2020-07-19 DIAGNOSIS — Z1159 Encounter for screening for other viral diseases: Secondary | ICD-10-CM

## 2020-07-19 DIAGNOSIS — Z0000 Encounter for general adult medical examination without abnormal findings: Secondary | ICD-10-CM

## 2020-07-19 DIAGNOSIS — E559 Vitamin D deficiency, unspecified: Secondary | ICD-10-CM

## 2020-07-19 MED ORDER — ESCITALOPRAM OXALATE 5 MG PO TABS: ORAL_TABLET | ORAL | 6 refills | Status: DC

## 2020-07-19 MED ORDER — MELOXICAM 15 MG PO TABS: 15.0000 mg | ORAL_TABLET | Freq: Every day | ORAL | 3 refills | Status: DC | PRN

## 2020-07-19 MED ORDER — ALPRAZOLAM 0.25 MG PO TABS: ORAL_TABLET | ORAL | 1 refills | Status: DC

## 2020-07-19 NOTE — Patient Instructions (Addendum)
F/U 1 year for Physical  Return for fasting labs
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
## 2020-07-20 NOTE — Assessment & Plan Note (Signed)
Overall doing well with meds No changes Exercise helps

## 2020-07-30 DIAGNOSIS — M25511 Pain in right shoulder: Secondary | ICD-10-CM | POA: Diagnosis not present

## 2020-08-02 ENCOUNTER — Other Ambulatory Visit: Payer: Self-pay | Admitting: Orthopedic Surgery

## 2020-08-02 DIAGNOSIS — R911 Solitary pulmonary nodule: Secondary | ICD-10-CM

## 2020-08-06 ENCOUNTER — Other Ambulatory Visit: Payer: Self-pay

## 2020-08-06 ENCOUNTER — Ambulatory Visit: Payer: BC Managed Care – PPO | Admitting: Family Medicine

## 2020-08-06 ENCOUNTER — Encounter: Payer: Self-pay | Admitting: Family Medicine

## 2020-08-06 VITALS — BP 100/62 | HR 68 | Temp 98.2°F | Resp 14 | Ht 62.5 in | Wt 135.0 lb

## 2020-08-06 DIAGNOSIS — J018 Other acute sinusitis: Secondary | ICD-10-CM

## 2020-08-06 MED ORDER — FLUCONAZOLE 150 MG PO TABS: 150.0000 mg | ORAL_TABLET | Freq: Once | ORAL | 0 refills | Status: AC

## 2020-08-06 MED ORDER — AMOXICILLIN 875 MG PO TABS: 875.0000 mg | ORAL_TABLET | Freq: Two times a day (BID) | ORAL | 0 refills | Status: DC

## 2020-08-06 MED ORDER — GUAIFENESIN-CODEINE 100-10 MG/5ML PO SOLN: 5.0000 mL | Freq: Four times a day (QID) | ORAL | 0 refills | Status: DC | PRN

## 2020-08-06 NOTE — Progress Notes (Signed)
   Subjective:    Patient ID: Christina [name], female    DOB: Mar 03, 1966, 54 y.o.   MRN: [id number]  Patient presents for Illness (x4 days- sinus pressure, head congestion, sore throat, nonproductive cough)  Pt here with sinus pressure drainage, sore throat has been severe,  non productive cough for the past 4 days , with post nasal drip   no fever,   normal taste and smell  COVID Vaccine UTD  Taking dayquil and nyquil , no chills or body aches    No GI symptoms     Has allegra at home        Her husband was sick recently   Review Of Systems:  GEN- denies fatigue, fever, weight loss,weakness, recent illness HEENT- denies eye drainage, change in vision, +nasal discharge, CVS- denies chest pain, palpitations RESP- denies SOB, +cough, wheeze ABD- denies N/V, change in stools, abd pain GU- denies dysuria, hematuria, dribbling, incontinence MSK- denies joint pain, muscle aches, injury Neuro- denies headache, dizziness, syncope, seizure activity       Objective:    BP 100/62   Pulse 68   Temp 98.2 F (36.8 C) (Temporal)   Resp 14   Ht 5' 2.5" (1.588 m)   Wt 135 lb (61.2 kg)   SpO2 97%   BMI 24.30 kg/m  GEN- NAD, alert and oriented x3 HEENT- PERRL, EOMI, non injected sclera, pink conjunctiva, MMM, oropharynx mild injection , no exudate , TM clear bilat no effusion,  No maxillary sinus tenderness, inflammed turbinates,  Nasal drainage  Neck- Supple, shotty submanbidular LAD CVS- RRR, no murmur RESP-CTAB EXT- No edema Pulses- Radial 2+         Assessment & Plan:      Problem List Items Addressed This Visit    None    Visit Diagnoses    Other acute sinusitis, recurrence not specified    -  Primary   Amoxicillin, add allergra, steroid nasal spray for post nasal drip, robitussin codiene for dry cough, covid testing done    Relevant Medications   amoxicillin (AMOXIL) 875 MG tablet   guaiFENesin-codeine 100-10 MG/5ML syrup   fluconazole (DIFLUCAN) 150 MG tablet    Other Relevant Orders   SARS-COV-2 RNA,(COVID-19) QUAL NAAT      Note: This dictation was prepared with Dragon dictation along with smaller phrase technology. Any transcriptional errors that result from this process are unintentional.

## 2020-08-06 NOTE — Patient Instructions (Signed)
F/U as needed

## 2020-08-07 LAB — SARS-COV-2 RNA,(COVID-19) QUALITATIVE NAAT: SARS CoV2 RNA: NOT DETECTED

## 2020-08-17 ENCOUNTER — Other Ambulatory Visit: Payer: BC Managed Care – PPO

## 2020-08-17 ENCOUNTER — Ambulatory Visit
Admission: RE | Admit: 2020-08-17 | Discharge: 2020-08-17 | Disposition: A | Discharge status: Home / Self Care | Payer: BC Managed Care – PPO | Source: Ambulatory Visit | Attending: Orthopedic Surgery | Admitting: Orthopedic Surgery

## 2020-08-17 ENCOUNTER — Other Ambulatory Visit: Payer: Self-pay

## 2020-08-17 DIAGNOSIS — Z136 Encounter for screening for cardiovascular disorders: Secondary | ICD-10-CM | POA: Diagnosis not present

## 2020-08-17 DIAGNOSIS — R911 Solitary pulmonary nodule: Secondary | ICD-10-CM

## 2020-08-17 DIAGNOSIS — E559 Vitamin D deficiency, unspecified: Secondary | ICD-10-CM

## 2020-08-17 DIAGNOSIS — Z0000 Encounter for general adult medical examination without abnormal findings: Secondary | ICD-10-CM

## 2020-08-17 DIAGNOSIS — K5730 Diverticulosis of large intestine without perforation or abscess without bleeding: Secondary | ICD-10-CM | POA: Diagnosis not present

## 2020-08-17 DIAGNOSIS — R918 Other nonspecific abnormal finding of lung field: Secondary | ICD-10-CM | POA: Diagnosis not present

## 2020-08-17 DIAGNOSIS — Z13220 Encounter for screening for lipoid disorders: Secondary | ICD-10-CM | POA: Diagnosis not present

## 2020-08-17 DIAGNOSIS — Z1159 Encounter for screening for other viral diseases: Secondary | ICD-10-CM | POA: Diagnosis not present

## 2020-08-17 DIAGNOSIS — E041 Nontoxic single thyroid nodule: Secondary | ICD-10-CM | POA: Diagnosis not present

## 2020-08-17 MED ORDER — IOPAMIDOL (ISOVUE-300) INJECTION 61%
75.0000 mL | Freq: Once | INTRAVENOUS | Status: AC | PRN
  Administered 2020-08-17: 75 mL via INTRAVENOUS

## 2020-08-18 LAB — CBC WITH DIFFERENTIAL/PLATELET
Absolute Monocytes: 378 cells/uL (ref 200–950)
Basophils Absolute: 29 cells/uL (ref 0–200)
Basophils Relative: 0.7 %
Eosinophils Absolute: 101 cells/uL (ref 15–500)
Eosinophils Relative: 2.4 %
HCT: 41 % (ref 35.0–45.0)
Hemoglobin: 13.8 g/dL (ref 11.7–15.5)
Lymphs Abs: 1919 cells/uL (ref 850–3900)
MCH: 33 pg (ref 27.0–33.0)
MCHC: 33.7 g/dL (ref 32.0–36.0)
MCV: 98.1 fL (ref 80.0–100.0)
MPV: 11.7 fL (ref 7.5–12.5)
Monocytes Relative: 9 %
Neutro Abs: 1772 cells/uL (ref 1500–7800)
Neutrophils Relative %: 42.2 %
Platelets: 212 10*3/uL (ref 140–400)
RBC: 4.18 10*6/uL (ref 3.80–5.10)
RDW: 11.9 % (ref 11.0–15.0)
Total Lymphocyte: 45.7 %
WBC: 4.2 10*3/uL (ref 3.8–10.8)

## 2020-08-18 LAB — COMPREHENSIVE METABOLIC PANEL
AG Ratio: 1.8 (calc) (ref 1.0–2.5)
ALT: 14 U/L (ref 6–29)
AST: 17 U/L (ref 10–35)
Albumin: 4.2 g/dL (ref 3.6–5.1)
Alkaline phosphatase (APISO): 62 U/L (ref 37–153)
BUN: 11 mg/dL (ref 7–25)
CO2: 26 mmol/L (ref 20–32)
Calcium: 9.4 mg/dL (ref 8.6–10.4)
Chloride: 102 mmol/L (ref 98–110)
Creat: 0.77 mg/dL (ref 0.50–1.05)
Globulin: 2.3 g/dL (calc) (ref 1.9–3.7)
Glucose, Bld: 84 mg/dL (ref 65–99)
Potassium: 4.2 mmol/L (ref 3.5–5.3)
Sodium: 137 mmol/L (ref 135–146)
Total Bilirubin: 0.5 mg/dL (ref 0.2–1.2)
Total Protein: 6.5 g/dL (ref 6.1–8.1)

## 2020-08-18 LAB — LIPID PANEL
Cholesterol: 222 mg/dL — ABNORMAL HIGH (ref <200)
HDL: 89 mg/dL (ref >=50)
LDL Cholesterol (Calc): 113 mg/dL (calc) — ABNORMAL HIGH
Non-HDL Cholesterol (Calc): 133 mg/dL (calc) — ABNORMAL HIGH (ref <130)
Total CHOL/HDL Ratio: 2.5 (calc) (ref <5.0)
Triglycerides: 96 mg/dL (ref <150)

## 2020-08-18 LAB — VITAMIN D 25 HYDROXY (VIT D DEFICIENCY, FRACTURES): Vit D, 25-Hydroxy: 26 ng/mL — ABNORMAL LOW (ref 30–100)

## 2020-08-18 LAB — HEPATITIS C ANTIBODY
Hepatitis C Ab: NONREACTIVE
SIGNAL TO CUT-OFF: 0 (ref <1.00)

## 2020-08-23 ENCOUNTER — Other Ambulatory Visit: Payer: Self-pay | Admitting: Obstetrics and Gynecology

## 2020-08-23 DIAGNOSIS — N6489 Other specified disorders of breast: Secondary | ICD-10-CM

## 2020-08-25 DIAGNOSIS — M75101 Unspecified rotator cuff tear or rupture of right shoulder, not specified as traumatic: Secondary | ICD-10-CM | POA: Diagnosis not present

## 2020-08-25 DIAGNOSIS — M19011 Primary osteoarthritis, right shoulder: Secondary | ICD-10-CM | POA: Diagnosis not present

## 2020-08-25 DIAGNOSIS — M25511 Pain in right shoulder: Secondary | ICD-10-CM | POA: Diagnosis not present

## 2020-11-10 ENCOUNTER — Encounter: Payer: Self-pay | Admitting: Family Medicine

## 2020-11-10 ENCOUNTER — Telehealth: Payer: BC Managed Care – PPO | Admitting: Family Medicine

## 2020-11-10 ENCOUNTER — Other Ambulatory Visit: Payer: Self-pay

## 2020-11-10 DIAGNOSIS — Z20822 Contact with and (suspected) exposure to covid-19: Secondary | ICD-10-CM

## 2020-11-10 DIAGNOSIS — J018 Other acute sinusitis: Secondary | ICD-10-CM

## 2020-11-10 MED ORDER — AMOXICILLIN 875 MG PO TABS: 875.0000 mg | ORAL_TABLET | Freq: Two times a day (BID) | ORAL | 0 refills | Status: DC

## 2020-11-10 NOTE — Progress Notes (Signed)
Virtual Visit via Video Note  I connected with Christina [name] on 11/10/20 at 2:11pm  by a video enabled telemedicine application and verified that I am speaking with the correct person using two identifiers.  Location: Patient: at home  Provider: in office    I discussed the limitations of evaluation and management by telemedicine and the availability of in person appointments. The patient expressed understanding and agreed to proceed.  History of Present Illness: Health visit in the setting of COVID-19.  Patient called in with sick symptoms.  She has had sinus pressure and drainage for the past few weeks she has been treating at home as she does have underlying sinus and allergies.  However Monday she started feeling more fatigued and to states she had chills with body aches and fever T-max 101 F.  She has had minimal cough mostly from postnasal drip. No SOB  She was in contact with her sister last week who tested positive for COVID-19.  She has had her vaccines along with her booster.  She has not had any GI symptoms.  She has not had any fever today.  She has taken acetaminophen as well as her typical allergy and sinus meds.  No one else in her current household has any symptoms.   Observations/Objective: Gen NAD, alert and oriented x 3, non toxic appearing  RESP- Normal WOB, speaking full sentences    Assessment and Plan: Acute sinusitis- POSSIBLY bacterial superinfection causing the fever, however with direct covid exposure last week and change in symptoms, will swab for COVID -19 Start amoxicillin along with other previous sinus regimen Pt oxygen sat  98% RA HR 86 when she came for swab   Follow Up Instructions:    I discussed the assessment and treatment plan with the patient. The patient was provided an opportunity to ask questions and all were answered. The patient agreed with the plan and demonstrated an understanding of the instructions.   The patient was advised to call back  or seek an in-person evaluation if the symptoms worsen or if the condition fails to improve as anticipated.  I provided 9 minutes of non-face-to-face time during this encounter. End Time: 2:20pm  Vic Blackbird, MD

## 2020-11-12 LAB — SARS-COV-2 RNA,(COVID-19) QUALITATIVE NAAT: SARS CoV2 RNA: NOT DETECTED

## 2020-11-25 ENCOUNTER — Encounter: Payer: Self-pay | Admitting: Nurse Practitioner

## 2020-11-25 ENCOUNTER — Other Ambulatory Visit: Payer: Self-pay

## 2020-11-25 ENCOUNTER — Ambulatory Visit (INDEPENDENT_AMBULATORY_CARE_PROVIDER_SITE_OTHER): Payer: BC Managed Care – PPO | Admitting: Nurse Practitioner

## 2020-11-25 VITALS — BP 104/62 | HR 55 | Temp 97.5°F | Ht 62.5 in | Wt 138.0 lb

## 2020-11-25 DIAGNOSIS — N3000 Acute cystitis without hematuria: Secondary | ICD-10-CM

## 2020-11-25 DIAGNOSIS — Z800 Family history of malignant neoplasm of digestive organs: Secondary | ICD-10-CM | POA: Insufficient documentation

## 2020-11-25 DIAGNOSIS — R300 Dysuria: Secondary | ICD-10-CM | POA: Diagnosis not present

## 2020-11-25 DIAGNOSIS — R399 Unspecified symptoms and signs involving the genitourinary system: Secondary | ICD-10-CM | POA: Diagnosis not present

## 2020-11-25 LAB — URINALYSIS, ROUTINE W REFLEX MICROSCOPIC
Bilirubin Urine: NEGATIVE
Glucose, UA: NEGATIVE
Hgb urine dipstick: NEGATIVE
Hyaline Cast: NONE SEEN /LPF
Ketones, ur: NEGATIVE
Leukocytes,Ua: NEGATIVE
Nitrite: POSITIVE — AB
Protein, ur: NEGATIVE
RBC / HPF: NONE SEEN /HPF (ref 0–2)
Specific Gravity, Urine: 1.015 (ref 1.001–1.03)
pH: 7 (ref 5.0–8.0)

## 2020-11-25 LAB — MICROSCOPIC MESSAGE

## 2020-11-25 MED ORDER — NITROFURANTOIN MONOHYD MACRO 100 MG PO CAPS: 100.0000 mg | ORAL_CAPSULE | Freq: Two times a day (BID) | ORAL | 0 refills | Status: DC

## 2020-11-25 NOTE — Progress Notes (Signed)
Subjective:    Patient ID: Christina [name], female    DOB: 08-06-1966, 56 y.o.   MRN: [id number]  HPI: Christina [name] is a 55 y.o. female presenting for urinary tract infection symptoms.  Chief Complaint  Patient presents with  . Urinary Frequency    Burn with urination   URINARY SYMPTOMS Duration: ~ 7 hours Dysuria: burning Urinary frequency: yes Urgency: yes Small volume voids: yes Symptom severity: mild Urinary incontinence: no Foul odor: yes Hematuria: no Abdominal pain: no Back pain: no Suprapubic pain/pressure: yes Flank pain: no Fever:  No Nausea: no Vomiting: no Relief with cranberry juice: yes Relief with pyridium: yes Status: better  Previous urinary tract infection: yes; years ago Recurrent urinary tract infection: no; reports it has been "years" since last UTI Sexual activity: currently sexually active with 1 female partner History of sexually transmitted disease: no Vaginal discharge: no Treatments attempted: Azo, cranberry supplement, drinking more water  No Known Allergies  Outpatient Encounter Medications as of 11/25/2020  Medication Sig  . ALPRAZolam (XANAX) 0.25 MG tablet TAKE 1 TABLET BY MOUTH TWICE A DAY AS NEEDED FOR ANXIETY  . B Complex Vitamins (B COMPLEX 50 PO) Take by mouth daily.  . cholecalciferol (VITAMIN D) 1000 units tablet Take 1,000 Units by mouth daily.  Marland Kitchen escitalopram (LEXAPRO) 5 MG tablet TAKE 1 TABLET BY MOUTH EVERYDAY AT BEDTIME  . ibuprofen (ADVIL,MOTRIN) 200 MG tablet Take 200 mg by mouth every 6 (six) hours as needed.  . meloxicam (MOBIC) 15 MG tablet Take 1 tablet (15 mg total) by mouth daily as needed for pain.  . nitrofurantoin, macrocrystal-monohydrate, (MACROBID) 100 MG capsule Take 1 capsule (100 mg total) by mouth 2 (two) times daily.  . [DISCONTINUED] amoxicillin (AMOXIL) 875 MG tablet Take 1 tablet (875 mg total) by mouth 2 (two) times daily.   Facility-Administered Encounter Medications as of 11/25/2020  Medication  .  0.9 %  sodium chloride infusion    Patient Active Problem List   Diagnosis Date Noted  . Family history of malignant neoplasm of gastrointestinal tract 11/25/2020  . Family history of neoplasm of breast 11/07/2017  . Tinnitus 01/11/2016  . Dysfunction of both eustachian tubes 01/11/2016  . Sensorineural hearing loss (SNHL), bilateral 01/11/2016  . Family history of colon cancer 03/31/2015  . Hx of adenomatous and sessile serrated colonic polyps 03/19/2015  . Depression with anxiety 06/17/2010  . GERD 06/17/2010  . FLANK PAIN, LEFT 06/16/2010    Past Medical History:  Diagnosis Date  . Allergy   . Hx of adenomatous and sessile serrated colonic polyps 03/19/2015    Relevant past medical, surgical, family and social history reviewed and updated as indicated. Interim medical history since our last visit reviewed.  Review of Systems Per HPI unless specifically indicated above     Objective:    BP 104/62 (BP Location: Left Arm, Patient Position: Sitting, Cuff Size: Normal)   Pulse (!) 55   Temp (!) 97.5 F (36.4 C) (Temporal)   Ht 5' 2.5" (1.588 m)   Wt 138 lb (62.6 kg)   SpO2 98%   BMI 24.84 kg/m   Wt Readings from Last 3 Encounters:  11/25/20 138 lb (62.6 kg)  08/06/20 135 lb (61.2 kg)  07/19/20 137 lb (62.1 kg)    Physical Exam Vitals and nursing note reviewed.  Constitutional:      General: She is not in acute distress.    Appearance: Normal appearance. She is not toxic-appearing.  Abdominal:  General: Abdomen is flat. Bowel sounds are normal. There is no distension.     Palpations: Abdomen is soft.     Tenderness: There is no abdominal tenderness. There is no right CVA tenderness, left CVA tenderness, guarding or rebound.     Hernia: No hernia is present.  Skin:    General: Skin is warm and dry.     Coloration: Skin is not jaundiced or pale.     Findings: No erythema.  Neurological:     Mental Status: She is alert and oriented to person, place, and time.      Motor: No weakness.     Gait: Gait normal.  Psychiatric:        Mood and Affect: Mood normal.        Behavior: Behavior normal.        Thought Content: Thought content normal.        Judgment: Judgment normal.       Assessment & Plan:  1. Acute cystitis without hematuria Acute, ongoing x 1 day.  UA + for nitrites.  Few bacteria on microscopic examination. Will send urine for culture and in meantime, treat for acute cystitis with Macrobid bid x 5 days.  Patient to return to clinic if symptoms persist despite treatment.  With any nausea/vomiting and unable to keep fluids down, go to ER.  - Urinalysis, Routine w reflex microscopic - Urine Culture - nitrofurantoin, macrocrystal-monohydrate, (MACROBID) 100 MG capsule; Take 1 capsule (100 mg total) by mouth 2 (two) times daily.  Dispense: 10 capsule; Refill: 0    Follow up plan: Return if symptoms worsen or fail to improve.

## 2020-11-25 NOTE — Patient Instructions (Signed)
Urinary Tract Infection, Adult A urinary tract infection (UTI) is an infection of any part of the urinary tract. The urinary tract includes:  The kidneys.  The ureters.  The bladder.  The urethra. These organs make, store, and get rid of pee (urine) in the body. What are the causes? This infection is caused by germs (bacteria) in your genital area. These germs grow and cause swelling (inflammation) of your urinary tract. What increases the risk? The following factors may make you more likely to develop this condition:  Using a small, thin tube (catheter) to drain pee.  Not being able to control when you pee or poop (incontinence).  Being female. If you are female, these things can increase the risk: ? Using these methods to prevent pregnancy:  A medicine that kills sperm (spermicide).  A device that blocks sperm (diaphragm). ? Having low levels of a female hormone (estrogen). ? Being pregnant. You are more likely to develop this condition if:  You have genes that add to your risk.  You are sexually active.  You take antibiotic medicines.  You have trouble peeing because of: ? A prostate that is bigger than normal, if you are female. ? A blockage in the part of your body that drains pee from the bladder. ? A kidney stone. ? A nerve condition that affects your bladder. ? Not getting enough to drink. ? Not peeing often enough.  You have other conditions, such as: ? Diabetes. ? A weak disease-fighting system (immune system). ? Sickle cell disease. ? Gout. ? Injury of the spine. What are the signs or symptoms? Symptoms of this condition include:  Needing to pee right away.  Peeing small amounts often.  Pain or burning when peeing.  Blood in the pee.  Pee that smells bad or not like normal.  Trouble peeing.  Pee that is cloudy.  Fluid coming from the vagina, if you are female.  Pain in the belly or lower back. Other symptoms include:  Vomiting.  Not  feeling hungry.  Feeling mixed up (confused). This may be the first symptom in older adults.  Being tired and grouchy (irritable).  A fever.  Watery poop (diarrhea). How is this treated?  Taking antibiotic medicine.  Taking other medicines.  Drinking enough water. In some cases, you may need to see a specialist. Follow these instructions at home: Medicines  Take over-the-counter and prescription medicines only as told by your doctor.  If you were prescribed an antibiotic medicine, take it as told by your doctor. Do not stop taking it even if you start to feel better. General instructions  Make sure you: ? Pee until your bladder is empty. ? Do not hold pee for a long time. ? Empty your bladder after sex. ? Wipe from front to back after peeing or pooping if you are a female. Use each tissue one time when you wipe.  Drink enough fluid to keep your pee pale yellow.  Keep all follow-up visits.   Contact a doctor if:  You do not get better after 1-2 days.  Your symptoms go away and then come back. Get help right away if:  You have very bad back pain.  You have very bad pain in your lower belly.  You have a fever.  You have chills.  You feeling like you will vomit or you vomit. Summary  A urinary tract infection (UTI) is an infection of any part of the urinary tract.  This condition is caused by   germs in your genital area.  There are many risk factors for a UTI.  Treatment includes antibiotic medicines.  Drink enough fluid to keep your pee pale yellow. This information is not intended to replace advice given to you by your health care provider. Make sure you discuss any questions you have with your health care provider. Document Revised: 05/28/2020 Document Reviewed: 05/28/2020 Elsevier Patient Education  2021 Elsevier Inc.  

## 2020-11-26 ENCOUNTER — Other Ambulatory Visit: Payer: Self-pay | Admitting: Family Medicine

## 2020-11-26 LAB — URINE CULTURE
MICRO NUMBER:: 11464788
SPECIMEN QUALITY:: ADEQUATE

## 2020-12-08 IMAGING — CT CT CHEST W/ CM
1 series · 15 of 34 positions shown, 19 images · IV contrast (APPLIED)
Comparison: None.

CLINICAL DATA: 54-year-old female with pulmonary nodule

EXAM:
CT CHEST WITH CONTRAST
TECHNIQUE: Multidetector CT imaging of the chest was performed during
intravenous contrast administration.
CONTRAST:  75mL N0NL2J-BRR IOPAMIDOL ([id number]-[id number]) INJECTION 61%

[Series 2: chest w/cm · axial · 0.67mm/px · z∈[-321,-67]mm · 15 of 149 slices shown, 19 images]
[im 11/149  mediastinal]
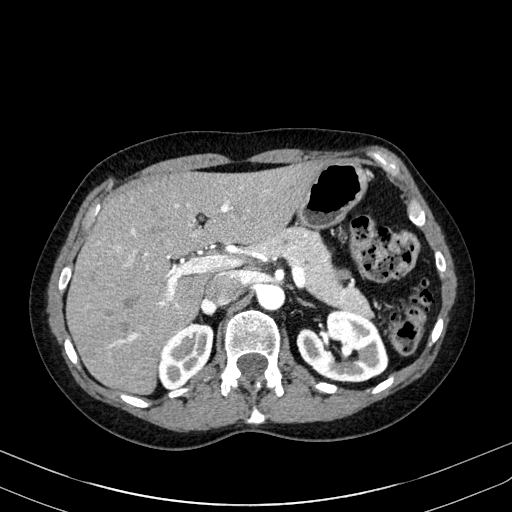
[im 11/149  lung]
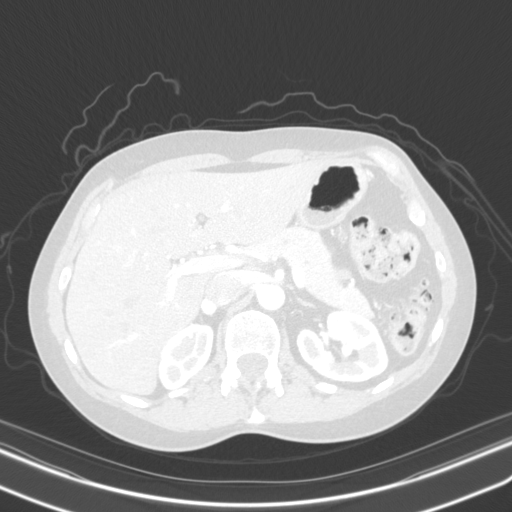
[im 22/149  lung]
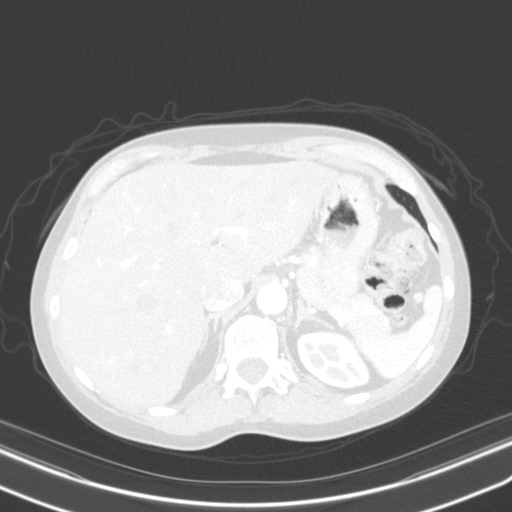
[im 30/149  lung]
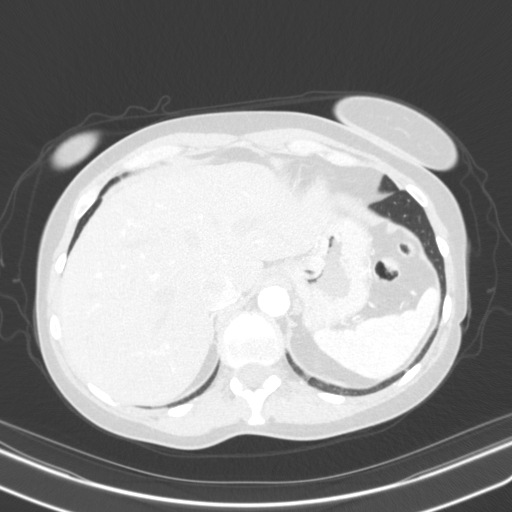
[im 39/149  lung]
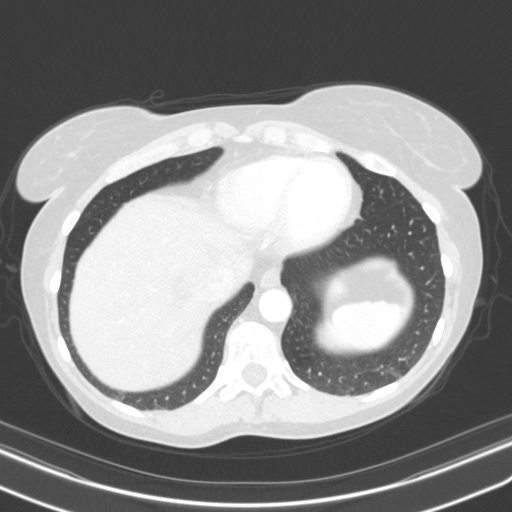
[im 50/149  mediastinal]
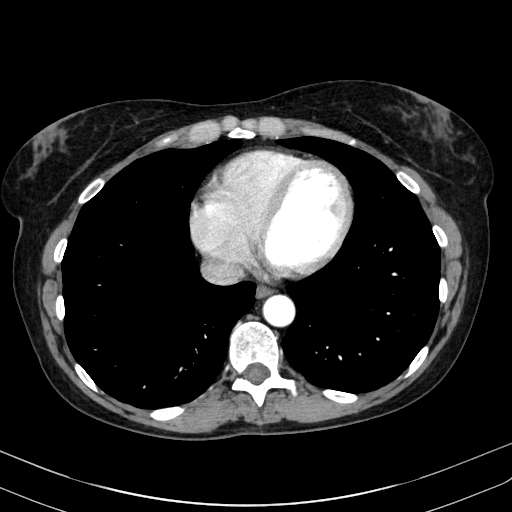
[im 50/149  lung]
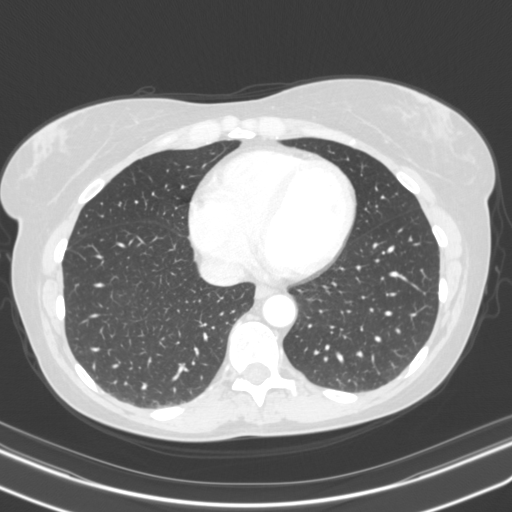
[im 60/149  lung]
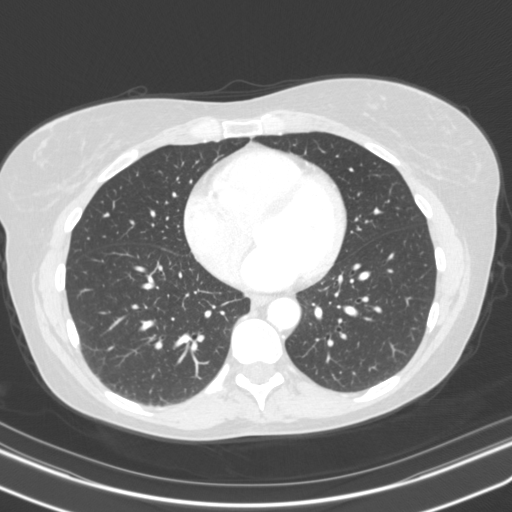
[im 66/149  lung]
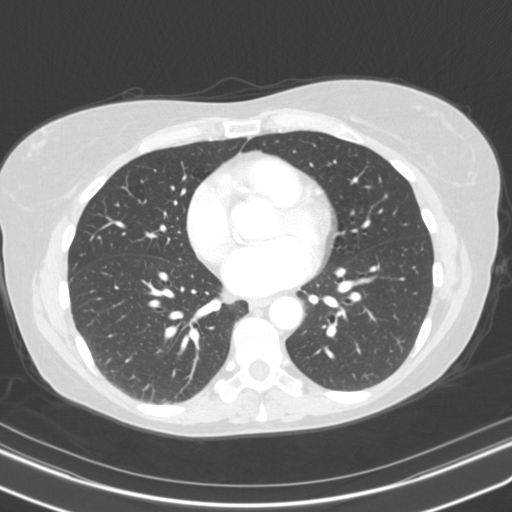
[im 77/149  lung]
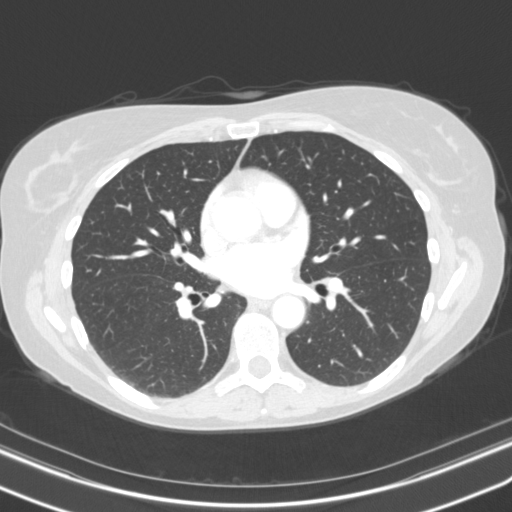
[im 83/149  mediastinal]
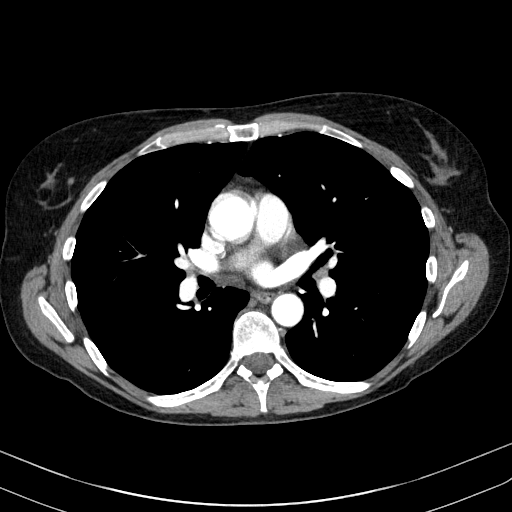
[im 83/149  lung]
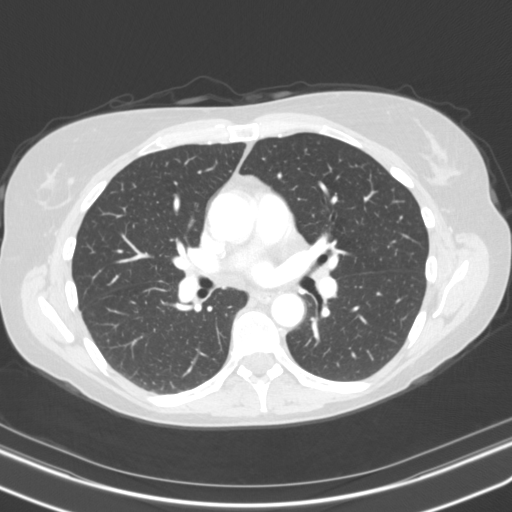
[im 89/149  lung]
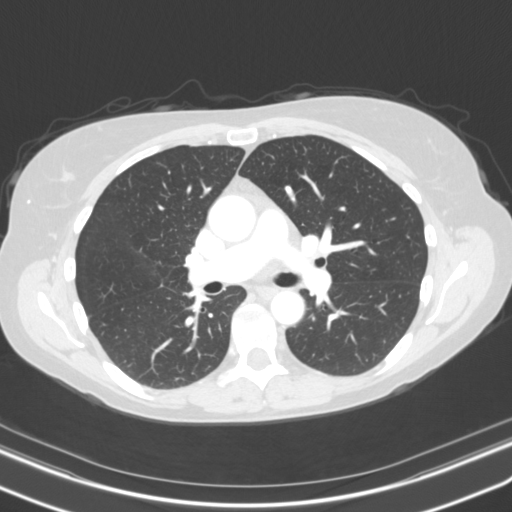
[im 99/149  lung]
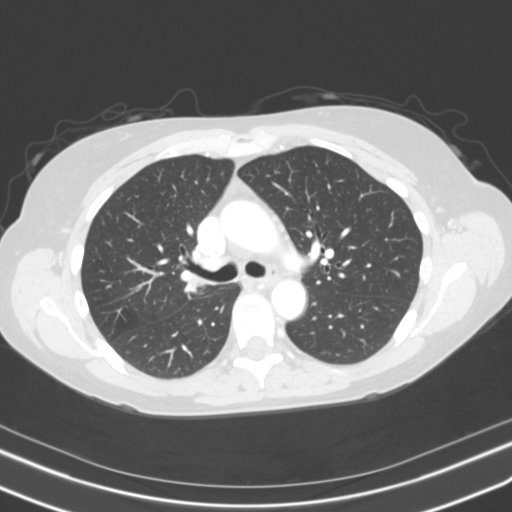
[im 110/149  lung]
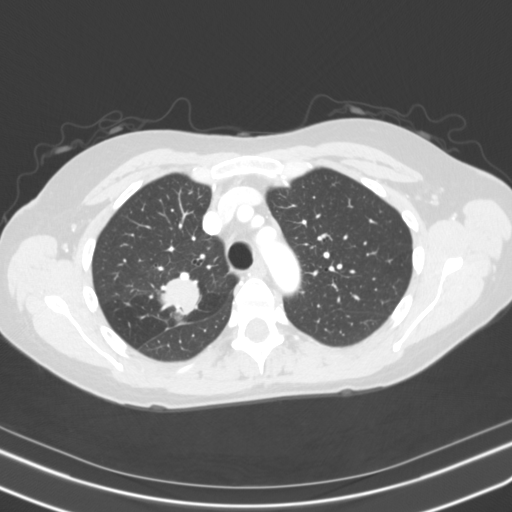
[im 119/149  mediastinal]
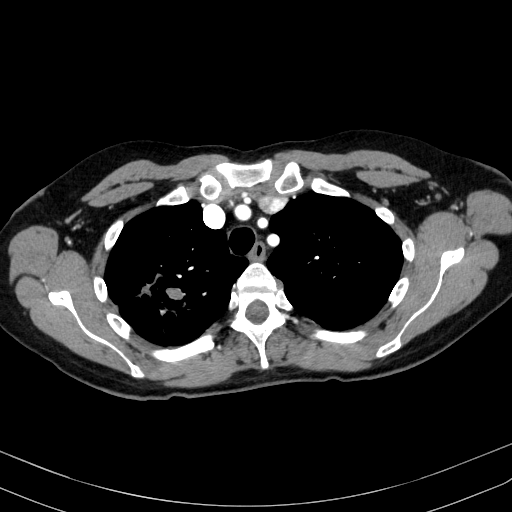
[im 119/149  lung]
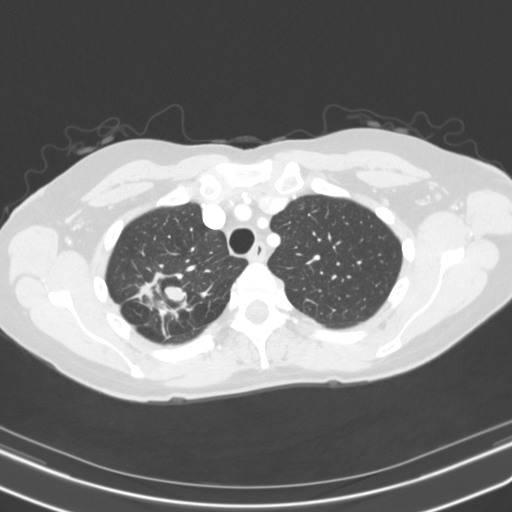
[im 127/149  lung]
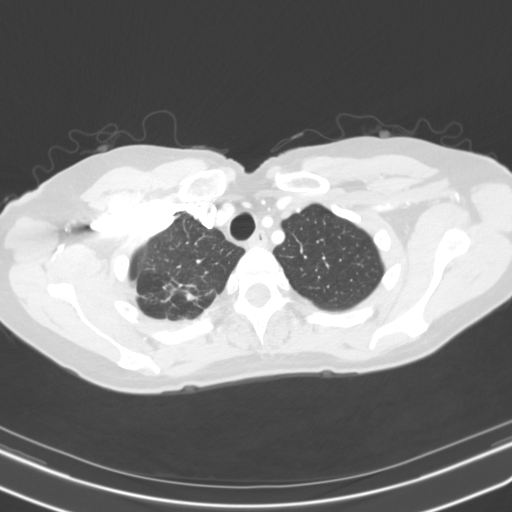
[im 138/149  lung]
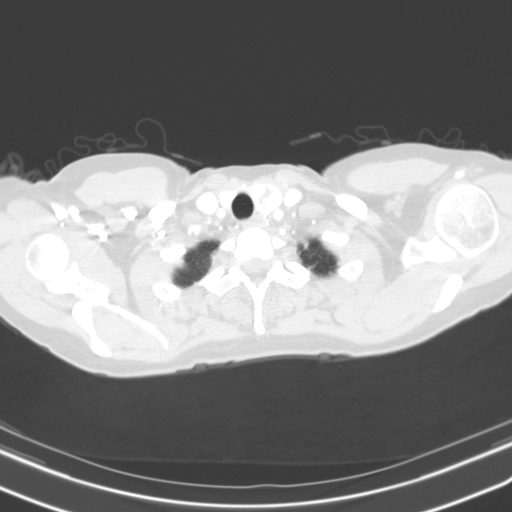

[15 of 34 positions shown; findings below may reference images not displayed]

FINDINGS: Cardiovascular: There is no cardiomegaly or pericardial effusion.
The thoracic aorta is unremarkable. The origins of the great vessels
of the aortic arch are patent. No pulmonary artery embolus
identified.

Mediastinum/Nodes: There is no hilar or mediastinal adenopathy. The
esophagus is grossly unremarkable. There is a 2 cm heterogeneously
enhancing left thyroid nodule. Recommend thyroid US (ref: [HOSPITAL]. [DATE]): 143-50).No mediastinal fluid collection.

Lungs/Pleura: There is a 4 cm elongated low attenuating content in
the right upper lobe extending from the hilum along the right upper
lobe bronchus most consistent with a bronchocele. Underlying
endobronchial lesion causing bronchial obstruction is not excluded.
Clinical correlation and pulmonary consult for potential
bronchoscopy is advised. Follow-up as per pulmonary recommendation.
Linear and streaky densities in the right upper lobe adjacent to
this density may represent atelectasis/scarring or developing
infiltrate. The left lung is clear. There is no pleural effusion
pneumothorax. The central airways are patent.

Upper Abdomen: Colonic diverticulosis.

Musculoskeletal: No chest wall abnormality. No acute or significant
osseous findings.
IMPRESSION: 1. Right upper lobe mass most consistent with a bronchocele.
Clinical correlation and pulmonary consult is advised. Follow-up as
per pulmonary recommendation.
2. A 2 cm left thyroid nodule. Recommend thyroid US.

## 2020-12-24 ENCOUNTER — Other Ambulatory Visit: Payer: Self-pay

## 2020-12-24 ENCOUNTER — Ambulatory Visit
Admission: RE | Admit: 2020-12-24 | Discharge: 2020-12-24 | Disposition: A | Discharge status: Home / Self Care | Payer: BC Managed Care – PPO | Source: Ambulatory Visit | Attending: Obstetrics and Gynecology | Admitting: Obstetrics and Gynecology

## 2020-12-24 ENCOUNTER — Other Ambulatory Visit: Payer: Self-pay | Admitting: Obstetrics and Gynecology

## 2020-12-24 DIAGNOSIS — N6489 Other specified disorders of breast: Secondary | ICD-10-CM

## 2020-12-24 DIAGNOSIS — R922 Inconclusive mammogram: Secondary | ICD-10-CM | POA: Diagnosis not present

## 2020-12-27 DIAGNOSIS — Z01419 Encounter for gynecological examination (general) (routine) without abnormal findings: Secondary | ICD-10-CM | POA: Diagnosis not present

## 2020-12-27 DIAGNOSIS — R6882 Decreased libido: Secondary | ICD-10-CM | POA: Insufficient documentation

## 2020-12-27 DIAGNOSIS — Z6823 Body mass index (BMI) 23.0-23.9, adult: Secondary | ICD-10-CM | POA: Diagnosis not present

## 2020-12-27 DIAGNOSIS — B001 Herpesviral vesicular dermatitis: Secondary | ICD-10-CM | POA: Insufficient documentation

## 2021-01-21 ENCOUNTER — Other Ambulatory Visit: Payer: Self-pay | Admitting: Family Medicine

## 2021-01-21 NOTE — Telephone Encounter (Signed)
Ok to refill??  Last office visit 11/10/2020.  Last refill 209/20/2021, #1 refill.

## 2021-03-21 ENCOUNTER — Other Ambulatory Visit: Payer: Self-pay | Admitting: Family Medicine

## 2021-05-12 DIAGNOSIS — R21 Rash and other nonspecific skin eruption: Secondary | ICD-10-CM | POA: Diagnosis not present

## 2021-05-12 DIAGNOSIS — L578 Other skin changes due to chronic exposure to nonionizing radiation: Secondary | ICD-10-CM | POA: Diagnosis not present

## 2021-05-12 DIAGNOSIS — L821 Other seborrheic keratosis: Secondary | ICD-10-CM | POA: Diagnosis not present

## 2021-05-12 DIAGNOSIS — L570 Actinic keratosis: Secondary | ICD-10-CM | POA: Diagnosis not present

## 2021-07-22 ENCOUNTER — Other Ambulatory Visit: Payer: Self-pay | Admitting: *Deleted

## 2021-07-22 MED ORDER — ESCITALOPRAM OXALATE 5 MG PO TABS: ORAL_TABLET | ORAL | 6 refills | Status: DC

## 2021-08-10 DIAGNOSIS — F321 Major depressive disorder, single episode, moderate: Secondary | ICD-10-CM | POA: Diagnosis not present

## 2021-08-16 ENCOUNTER — Other Ambulatory Visit: Payer: Self-pay | Admitting: Family Medicine

## 2021-08-16 NOTE — Telephone Encounter (Signed)
Ok to refill??  Last office visit 11/25/2020.  Last refill 01/21/2021, #1 refill.

## 2021-08-17 DIAGNOSIS — F321 Major depressive disorder, single episode, moderate: Secondary | ICD-10-CM | POA: Diagnosis not present

## 2021-08-19 ENCOUNTER — Other Ambulatory Visit: Payer: Self-pay | Admitting: Family Medicine

## 2021-08-24 DIAGNOSIS — F321 Major depressive disorder, single episode, moderate: Secondary | ICD-10-CM | POA: Diagnosis not present

## 2021-08-31 DIAGNOSIS — F321 Major depressive disorder, single episode, moderate: Secondary | ICD-10-CM | POA: Diagnosis not present

## 2021-09-04 DIAGNOSIS — J019 Acute sinusitis, unspecified: Secondary | ICD-10-CM | POA: Diagnosis not present

## 2021-09-04 DIAGNOSIS — Z23 Encounter for immunization: Secondary | ICD-10-CM | POA: Diagnosis not present

## 2021-09-08 ENCOUNTER — Other Ambulatory Visit: Payer: Self-pay

## 2021-09-08 ENCOUNTER — Other Ambulatory Visit: Payer: BC Managed Care – PPO

## 2021-09-08 DIAGNOSIS — Z13220 Encounter for screening for lipoid disorders: Secondary | ICD-10-CM | POA: Diagnosis not present

## 2021-09-08 DIAGNOSIS — E559 Vitamin D deficiency, unspecified: Secondary | ICD-10-CM

## 2021-09-08 DIAGNOSIS — Z136 Encounter for screening for cardiovascular disorders: Secondary | ICD-10-CM | POA: Diagnosis not present

## 2021-09-09 LAB — CBC WITH DIFFERENTIAL/PLATELET
Absolute Monocytes: 400 cells/uL (ref 200–950)
Basophils Absolute: 19 cells/uL (ref 0–200)
Basophils Relative: 0.4 %
Eosinophils Absolute: 89 cells/uL (ref 15–500)
Eosinophils Relative: 1.9 %
HCT: 41.9 % (ref 35.0–45.0)
Hemoglobin: 14 g/dL (ref 11.7–15.5)
Lymphs Abs: 2167 cells/uL (ref 850–3900)
MCH: 32.6 pg (ref 27.0–33.0)
MCHC: 33.4 g/dL (ref 32.0–36.0)
MCV: 97.4 fL (ref 80.0–100.0)
MPV: 11.2 fL (ref 7.5–12.5)
Monocytes Relative: 8.5 %
Neutro Abs: 2026 cells/uL (ref 1500–7800)
Neutrophils Relative %: 43.1 %
Platelets: 238 10*3/uL (ref 140–400)
RBC: 4.3 10*6/uL (ref 3.80–5.10)
RDW: 11.9 % (ref 11.0–15.0)
Total Lymphocyte: 46.1 %
WBC: 4.7 10*3/uL (ref 3.8–10.8)

## 2021-09-09 LAB — COMPLETE METABOLIC PANEL WITH GFR
AG Ratio: 1.8 (calc) (ref 1.0–2.5)
ALT: 19 U/L (ref 6–29)
AST: 22 U/L (ref 10–35)
Albumin: 4.4 g/dL (ref 3.6–5.1)
Alkaline phosphatase (APISO): 66 U/L (ref 37–153)
BUN: 10 mg/dL (ref 7–25)
CO2: 29 mmol/L (ref 20–32)
Calcium: 9.6 mg/dL (ref 8.6–10.4)
Chloride: 102 mmol/L (ref 98–110)
Creat: 0.73 mg/dL (ref 0.50–1.03)
Globulin: 2.4 g/dL (calc) (ref 1.9–3.7)
Glucose, Bld: 84 mg/dL (ref 65–99)
Potassium: 4.2 mmol/L (ref 3.5–5.3)
Sodium: 139 mmol/L (ref 135–146)
Total Bilirubin: 0.4 mg/dL (ref 0.2–1.2)
Total Protein: 6.8 g/dL (ref 6.1–8.1)
eGFR: 98 mL/min/{1.73_m2} (ref >=60)

## 2021-09-09 LAB — LIPID PANEL
Cholesterol: 241 mg/dL — ABNORMAL HIGH (ref <200)
HDL: 88 mg/dL (ref >=50)
LDL Cholesterol (Calc): 128 mg/dL (calc) — ABNORMAL HIGH
Non-HDL Cholesterol (Calc): 153 mg/dL (calc) — ABNORMAL HIGH (ref <130)
Total CHOL/HDL Ratio: 2.7 (calc) (ref <5.0)
Triglycerides: 134 mg/dL (ref <150)

## 2021-09-09 LAB — VITAMIN D 25 HYDROXY (VIT D DEFICIENCY, FRACTURES): Vit D, 25-Hydroxy: 34 ng/mL (ref 30–100)

## 2021-09-12 ENCOUNTER — Ambulatory Visit (INDEPENDENT_AMBULATORY_CARE_PROVIDER_SITE_OTHER): Payer: BC Managed Care – PPO | Admitting: Family Medicine

## 2021-09-12 ENCOUNTER — Encounter: Payer: Self-pay | Admitting: Family Medicine

## 2021-09-12 ENCOUNTER — Other Ambulatory Visit: Payer: Self-pay

## 2021-09-12 VITALS — BP 108/58 | HR 66 | Temp 97.7°F | Resp 18 | Ht 62.5 in | Wt 136.0 lb

## 2021-09-12 DIAGNOSIS — Z0000 Encounter for general adult medical examination without abnormal findings: Secondary | ICD-10-CM

## 2021-09-12 NOTE — Progress Notes (Signed)
Subjective:    Patient ID: Christina [name], female    DOB: 14-Feb-1966, 55 y.o.   MRN: [id number]  HPI Patient is here today for complete physical exam.  Overall she is doing well.  She is due for the second shingles vaccine.  She is also due for the COVID booster.  The remainder of her vaccinations are up-to-date.  Her mammogram was performed in February and is up-to-date.  Her Pap smear was performed 2 years ago and is due this year.  She gets this done at her gynecologist.  Her colonoscopy is up-to-date.  Her most recent lab work is listed below: Lab on 09/08/2021  Component Date Value Ref Range Status   WBC 09/08/2021 4.7  3.8 - 10.8 Thousand/uL Final   RBC 09/08/2021 4.30  3.80 - 5.10 Million/uL Final   Hemoglobin 09/08/2021 14.0  11.7 - 15.5 g/dL Final   HCT 09/08/2021 41.9  35.0 - 45.0 % Final   MCV 09/08/2021 97.4  80.0 - 100.0 fL Final   MCH 09/08/2021 32.6  27.0 - 33.0 pg Final   MCHC 09/08/2021 33.4  32.0 - 36.0 g/dL Final   RDW 09/08/2021 11.9  11.0 - 15.0 % Final   Platelets 09/08/2021 238  140 - 400 Thousand/uL Final   MPV 09/08/2021 11.2  7.5 - 12.5 fL Final   Neutro Abs 09/08/2021 2,026  1,500 - 7,800 cells/uL Final   Lymphs Abs 09/08/2021 2,167  850 - 3,900 cells/uL Final   Absolute Monocytes 09/08/2021 400  200 - 950 cells/uL Final   Eosinophils Absolute 09/08/2021 89  15 - 500 cells/uL Final   Basophils Absolute 09/08/2021 19  0 - 200 cells/uL Final   Neutrophils Relative % 09/08/2021 43.1  % Final   Total Lymphocyte 09/08/2021 46.1  % Final   Monocytes Relative 09/08/2021 8.5  % Final   Eosinophils Relative 09/08/2021 1.9  % Final   Basophils Relative 09/08/2021 0.4  % Final   Glucose, Bld 09/08/2021 84  65 - 99 mg/dL Final   Comment: .            Fasting reference interval .    BUN 09/08/2021 10  7 - 25 mg/dL Final   Creat 09/08/2021 0.73  0.50 - 1.03 mg/dL Final   eGFR 09/08/2021 98  > OR = 60 mL/min/1.65m Final   Comment: The eGFR is based on the CKD-EPI 2021  equation. To calculate  the new eGFR from a previous Creatinine or Cystatin C result, go to https://www.kidney.org/professionals/ kdoqi/gfr%5Fcalculator    BUN/Creatinine Ratio 176/81/1572NOT APPLICABLE  6 - 22 (calc) Final   Sodium 09/08/2021 139  135 - 146 mmol/L Final   Potassium 09/08/2021 4.2  3.5 - 5.3 mmol/L Final   Chloride 09/08/2021 102  98 - 110 mmol/L Final   CO2 09/08/2021 29  20 - 32 mmol/L Final   Calcium 09/08/2021 9.6  8.6 - 10.4 mg/dL Final   Total Protein 09/08/2021 6.8  6.1 - 8.1 g/dL Final   Albumin 09/08/2021 4.4  3.6 - 5.1 g/dL Final   Globulin 09/08/2021 2.4  1.9 - 3.7 g/dL (calc) Final   AG Ratio 09/08/2021 1.8  1.0 - 2.5 (calc) Final   Total Bilirubin 09/08/2021 0.4  0.2 - 1.2 mg/dL Final   Alkaline phosphatase (APISO) 09/08/2021 66  37 - 153 U/L Final   AST 09/08/2021 22  10 - 35 U/L Final   ALT 09/08/2021 19  6 - 29 U/L Final   Cholesterol 09/08/2021  241 (A)  <200 mg/dL Final   HDL 09/08/2021 88  > OR = 50 mg/dL Final   Triglycerides 09/08/2021 134  <150 mg/dL Final   LDL Cholesterol (Calc) 09/08/2021 128 (A)  mg/dL (calc) Final   Comment: Reference range: <100 . Desirable range <100 mg/dL for primary prevention;   <70 mg/dL for patients with CHD or diabetic patients  with > or = 2 CHD risk factors. Marland Kitchen LDL-C is now calculated using the Martin-Hopkins  calculation, which is a validated novel method providing  better accuracy than the Friedewald equation in the  estimation of LDL-C.  Cresenciano Genre et al. Annamaria Helling. 4259;563(87): 2061-2068  (http://education.[facility].com/faq/FAQ164)    Total CHOL/HDL Ratio 09/08/2021 2.7  <5.0 (calc) Final   Non-HDL Cholesterol (Calc) 09/08/2021 153 (A)  <130 mg/dL (calc) Final   Comment: For patients with diabetes plus 1 major ASCVD risk  factor, treating to a non-HDL-C goal of <100 mg/dL  (LDL-C of <70 mg/dL) is considered a therapeutic  option.    Vit D, 25-Hydroxy 09/08/2021 34  30 - 100 ng/mL Final   Comment:  Vitamin D Status         25-OH Vitamin D: . Deficiency:                    <20 ng/mL Insufficiency:             20 - 29 ng/mL Optimal:                 > or = 30 ng/mL . For 25-OH Vitamin D testing on patients on  D2-supplementation and patients for whom quantitation  of D2 and D3 fractions is required, the [facility]ure[facility](TM) 25-OH VIT D, (D2,D3), LC/MS/MS is recommended: order  code [phone] (patients >78yr). See Note 1 . Note 1 . For additional information, please refer to  http://education.[facility].com/faq/FAQ199  (This link is being provided for informational/ educational purposes only.)     Past Medical History:  Diagnosis Date   Allergy    Hx of adenomatous and sessile serrated colonic polyps 03/19/2015   Past Surgical History:  Procedure Laterality Date   CESAREAN SECTION     CESAREAN SECTION N/A    [employer] 07/19/2020   COLONOSCOPY  2016   KNEE ARTHROSCOPY     right knee   Current Outpatient Medications on File Prior to Visit  Medication Sig Dispense Refill   ALPRAZolam (XANAX) 0.25 MG tablet TAKE 1 TABLET BY MOUTH TWICE A DAY AS NEEDED FOR ANXIETY 60 tablet 1   B Complex Vitamins (B COMPLEX 50 PO) Take by mouth daily.     cholecalciferol (VITAMIN D) 1000 units tablet Take 1,000 Units by mouth daily.     escitalopram (LEXAPRO) 5 MG tablet TAKE 1 TABLET BY MOUTH EVERYDAY AT BEDTIME 90 tablet 3   ibuprofen (ADVIL,MOTRIN) 200 MG tablet Take 200 mg by mouth every 6 (six) hours as needed.     meloxicam (MOBIC) 15 MG tablet TAKE 1 TABLET BY MOUTH EVERY DAY AS NEEDED FOR PAIN 30 tablet 3   nitrofurantoin, macrocrystal-monohydrate, (MACROBID) 100 MG capsule Take 1 capsule (100 mg total) by mouth 2 (two) times daily. 10 capsule 0   No current facility-administered medications on file prior to visit.   No Known Allergies Social History   Socioeconomic History   Marital status: Married    Spouse name: Not on file   Number of children: Not on file   Years of  education: Not on file   Highest  education level: Not on file  Occupational History   Not on file  Tobacco Use   Smoking status: Former    Types: Cigarettes    Quit date: 03/18/2002    Years since quitting: 19.5   Smokeless tobacco: Never  Vaping Use   Vaping Use: Never used  Substance and Sexual Activity   Alcohol use: Yes    Alcohol/week: 4.0 standard drinks    Types: 4 Glasses of wine per week    Comment: 4-5 drinks per week per pt   Drug use: No   Sexual activity: Not on file  Other Topics Concern   Not on file  Social History Narrative   Not on file   Social Determinants of Health   Financial Resource Strain: Not on file  Food Insecurity: Not on file  Transportation Needs: Not on file  Physical Activity: Not on file  Stress: Not on file  Social Connections: Not on file  Intimate Partner Violence: Not on file   Family History  Problem Relation Age of Onset   Bladder Cancer Mother    Hypertension Mother    Hyperlipidemia Mother    Colon cancer Father 55   Cancer Father 4       colon cancer    Colon cancer Maternal Grandmother    Heart disease Maternal Grandmother    Bipolar disorder Sister    Heart disease Brother 39       Heart Attack   Alcohol abuse Brother    Bipolar disorder Sister    Depression Maternal Uncle    Cancer Paternal Uncle    Cancer Paternal Grandmother        Breast cancer      Review of Systems  All other systems reviewed and are negative.     Objective:   Physical Exam Constitutional:      General: She is not in acute distress.    Appearance: Normal appearance. She is normal weight. She is not ill-appearing.  HENT:     Right Ear: Tympanic membrane, ear canal and external ear normal. There is no impacted cerumen.     Left Ear: Tympanic membrane, ear canal and external ear normal. There is no impacted cerumen.     Nose: Nose normal. No congestion or rhinorrhea.     Mouth/Throat:     Mouth: Mucous membranes are moist.      Pharynx: Oropharynx is clear. No oropharyngeal exudate or posterior oropharyngeal erythema.  Eyes:     General:        Right eye: No discharge.        Left eye: No discharge.     Extraocular Movements: Extraocular movements intact.     Conjunctiva/sclera: Conjunctivae normal.     Pupils: Pupils are equal, round, and reactive to light.  Neck:     Vascular: No carotid bruit.  Cardiovascular:     Rate and Rhythm: Normal rate and regular rhythm.     Pulses: Normal pulses.     Heart sounds: Normal heart sounds. No murmur heard.   No friction rub. No gallop.  Pulmonary:     Effort: Pulmonary effort is normal. No respiratory distress.     Breath sounds: Normal breath sounds. No stridor. No wheezing, rhonchi or rales.  Abdominal:     General: Bowel sounds are normal. There is no distension.     Palpations: Abdomen is soft.     Tenderness: There is no abdominal tenderness. There is no guarding or rebound.  Musculoskeletal:  General: Normal range of motion.     Cervical back: Normal range of motion and neck supple. No rigidity or tenderness.     Right lower leg: No edema.     Left lower leg: No edema.  Lymphadenopathy:     Cervical: No cervical adenopathy.  Skin:    General: Skin is warm.     Coloration: Skin is not jaundiced or pale.     Findings: No bruising, erythema, lesion or rash.  Neurological:     General: No focal deficit present.     Mental Status: She is alert and oriented to person, place, and time. Mental status is at baseline.     Cranial Nerves: No cranial nerve deficit.     Sensory: No sensory deficit.     Motor: No weakness.     Coordination: Coordination normal.     Gait: Gait normal.     Deep Tendon Reflexes: Reflexes normal.  Psychiatric:        Mood and Affect: Mood normal.        Behavior: Behavior normal.        Thought Content: Thought content normal.        Judgment: Judgment normal.         Assessment & Plan:   Routine general medical  examination at a health care facility Physical exam today is completely normal.  Regular anticipatory guidance is provided.  Lab work is outstanding.  Recommended shingles vaccine as well as the COVID booster.  Mammogram, colonoscopy, are up-to-date.  Recommended she follow-up with her gynecologist for a Pap smear.

## 2021-09-14 DIAGNOSIS — F321 Major depressive disorder, single episode, moderate: Secondary | ICD-10-CM | POA: Diagnosis not present

## 2021-09-21 DIAGNOSIS — F321 Major depressive disorder, single episode, moderate: Secondary | ICD-10-CM | POA: Diagnosis not present

## 2021-09-28 DIAGNOSIS — F321 Major depressive disorder, single episode, moderate: Secondary | ICD-10-CM | POA: Diagnosis not present

## 2021-10-16 DIAGNOSIS — U071 COVID-19: Secondary | ICD-10-CM | POA: Diagnosis not present

## 2021-10-16 DIAGNOSIS — J029 Acute pharyngitis, unspecified: Secondary | ICD-10-CM | POA: Diagnosis not present

## 2021-10-16 DIAGNOSIS — J069 Acute upper respiratory infection, unspecified: Secondary | ICD-10-CM | POA: Diagnosis not present

## 2021-10-17 ENCOUNTER — Other Ambulatory Visit: Payer: Self-pay | Admitting: Family Medicine

## 2021-10-17 MED ORDER — NIRMATRELVIR/RITONAVIR (PAXLOVID)TABLET: 3.0000 | ORAL_TABLET | Freq: Two times a day (BID) | ORAL | 0 refills | Status: AC

## 2021-10-19 DIAGNOSIS — F321 Major depressive disorder, single episode, moderate: Secondary | ICD-10-CM | POA: Diagnosis not present

## 2021-11-07 ENCOUNTER — Telehealth: Payer: Self-pay

## 2021-11-07 ENCOUNTER — Encounter: Payer: Self-pay | Admitting: Family Medicine

## 2021-11-07 NOTE — Telephone Encounter (Signed)
Pt aware letter is available in My Chart. She will retreive from there. Nothing further needed at this time.

## 2021-11-07 NOTE — Telephone Encounter (Signed)
Pt called in requesting a letter from pcp that states she is the primary caregiver for daughter Claris Pong. Pt needs this letter as she has been summoned for jury duty. Pt will come to pick up letter when it is available. Please advise.   Cb#: [phone]

## 2021-11-07 NOTE — Telephone Encounter (Signed)
Ok to write letter to excuse from jury duty? Please advise, thanks!

## 2021-11-09 DIAGNOSIS — F321 Major depressive disorder, single episode, moderate: Secondary | ICD-10-CM | POA: Diagnosis not present

## 2021-11-10 ENCOUNTER — Encounter: Payer: Self-pay | Admitting: Family Medicine

## 2021-11-10 ENCOUNTER — Ambulatory Visit: Payer: BC Managed Care – PPO | Admitting: Family Medicine

## 2021-11-10 ENCOUNTER — Other Ambulatory Visit: Payer: Self-pay

## 2021-11-10 VITALS — BP 108/68 | HR 77 | Temp 97.6°F | Resp 18 | Ht 62.5 in | Wt 136.0 lb

## 2021-11-10 DIAGNOSIS — R918 Other nonspecific abnormal finding of lung field: Secondary | ICD-10-CM

## 2021-11-10 DIAGNOSIS — J018 Other acute sinusitis: Secondary | ICD-10-CM

## 2021-11-10 MED ORDER — FLUCONAZOLE 150 MG PO TABS: 150.0000 mg | ORAL_TABLET | Freq: Once | ORAL | 0 refills | Status: AC

## 2021-11-10 MED ORDER — AMOXICILLIN-POT CLAVULANATE 875-125 MG PO TABS: 1.0000 | ORAL_TABLET | Freq: Two times a day (BID) | ORAL | 0 refills | Status: DC

## 2021-11-10 NOTE — Progress Notes (Signed)
Subjective:    Patient ID: Christina [name], female    DOB: October 09, 1966, 56 y.o.   MRN: [id number]  HPI Patient is a very pleasant 56 year old Caucasian female who presents today with fever.  She was diagnosed with COVID prior to Christmas.  She completed antiviral therapy.  Most of the symptoms of COVID improved.  However she has Pain and pressure in her right frontal sinus for the last 3 weeks.  She reports fevers and chills and headache.  She is having postnasal drip.  She denies any cough.  She denies any pleurisy.  She denies any shortness of breath.  Of note, on a CT scan in 2021 she was found to have a mass in the right upper lobe of her lung that was thought to be a bronchocele.  She would like to repeat a CAT scan to monitor for any growth or change that. Past Medical History:  Diagnosis Date   Allergy    Hx of adenomatous and sessile serrated colonic polyps 03/19/2015   Past Surgical History:  Procedure Laterality Date   CESAREAN SECTION     CESAREAN SECTION N/A    [employer] 07/19/2020   COLONOSCOPY  2016   KNEE ARTHROSCOPY     right knee   Current Outpatient Medications on File Prior to Visit  Medication Sig Dispense Refill   ALPRAZolam (XANAX) 0.25 MG tablet TAKE 1 TABLET BY MOUTH TWICE A DAY AS NEEDED FOR ANXIETY 60 tablet 1   B Complex Vitamins (B COMPLEX 50 PO) Take by mouth daily.     cholecalciferol (VITAMIN D) 1000 units tablet Take 1,000 Units by mouth daily.     escitalopram (LEXAPRO) 5 MG tablet TAKE 1 TABLET BY MOUTH EVERYDAY AT BEDTIME (Patient taking differently: 2.5 mg. TAKE 1/2 TABLET BY MOUTH EVERYDAY AT BEDTIME) 90 tablet 3   ibuprofen (ADVIL,MOTRIN) 200 MG tablet Take 200 mg by mouth every 6 (six) hours as needed.     meloxicam (MOBIC) 15 MG tablet TAKE 1 TABLET BY MOUTH EVERY DAY AS NEEDED FOR PAIN 30 tablet 3   triamcinolone ointment (KENALOG) 0.1 % APPLY TO AFFECTED AREA TWICE A DAY     valACYclovir (VALTREX) 1000 MG tablet SMARTSIG:1 Tablet(s) By Mouth Every  12 Hours     No current facility-administered medications on file prior to visit.   No Known Allergies Social History   Socioeconomic History   Marital status: Married    Spouse name: Not on file   Number of children: Not on file   Years of education: Not on file   Highest education level: Not on file  Occupational History   Not on file  Tobacco Use   Smoking status: Former    Types: Cigarettes    Quit date: 03/18/2002    Years since quitting: 19.6   Smokeless tobacco: Never  Vaping Use   Vaping Use: Never used  Substance and Sexual Activity   Alcohol use: Yes    Alcohol/week: 4.0 standard drinks    Types: 4 Glasses of wine per week    Comment: 4-5 drinks per week per pt   Drug use: No   Sexual activity: Not on file  Other Topics Concern   Not on file  Social History Narrative   Not on file   Social Determinants of Health   Financial Resource Strain: Not on file  Food Insecurity: Not on file  Transportation Needs: Not on file  Physical Activity: Not on file  Stress: Not on file  Social Connections: Not on file  Intimate Partner Violence: Not on file      Review of Systems  All other systems reviewed and are negative.     Objective:   Physical Exam Vitals reviewed.  Constitutional:      General: She is not in acute distress.    Appearance: Normal appearance. She is normal weight. She is not ill-appearing or toxic-appearing.  HENT:     Right Ear: Tympanic membrane and ear canal normal.     Left Ear: Tympanic membrane and ear canal normal.     Nose: Congestion and rhinorrhea present.     Right Sinus: Maxillary sinus tenderness and frontal sinus tenderness present.     Mouth/Throat:     Pharynx: No oropharyngeal exudate or posterior oropharyngeal erythema.  Cardiovascular:     Rate and Rhythm: Normal rate and regular rhythm.     Heart sounds: Normal heart sounds.  Musculoskeletal:     Cervical back: Neck supple.  Neurological:     Mental Status: She  is alert.          Assessment & Plan:  Other acute sinusitis, recurrence not specified Begin Augmentin 875 mg twice daily for 10 days.  I believe the patient developed a secondary sinus infection after her recent COVID.  I will also schedule her for a CT scan of the lung to reevaluate the mass in the right upper lobe.

## 2021-11-14 ENCOUNTER — Telehealth: Payer: Self-pay

## 2021-11-14 NOTE — Telephone Encounter (Signed)
Pt called to report since having COVID late last year she has had issus with constipation. Pt has tried adding more fiber rich foods to her diet but this has not helped. Pt is also concerned she her hemorrhoids have flared. Pt advised to add in fiber supplement via capsules or powder, also can use Miralax as directed, along with hemorrhoid cream/pads. Pt advised to schedule OV if not improving. Pt voiced understanding. Nothing further needed at this time.

## 2021-12-06 ENCOUNTER — Inpatient Hospital Stay: Admission: RE | Admit: 2021-12-06 | Payer: BC Managed Care – PPO | Source: Ambulatory Visit

## 2021-12-17 ENCOUNTER — Other Ambulatory Visit: Payer: Self-pay | Admitting: Family Medicine

## 2021-12-19 ENCOUNTER — Other Ambulatory Visit: Payer: Self-pay

## 2021-12-19 NOTE — Telephone Encounter (Signed)
LOV 11/10/21 Last refill 08/16/21, #60, 1 refill  Please review, thanks!

## 2022-01-04 ENCOUNTER — Other Ambulatory Visit: Payer: Self-pay

## 2022-01-23 ENCOUNTER — Other Ambulatory Visit: Payer: Self-pay

## 2022-01-23 ENCOUNTER — Ambulatory Visit
Admission: RE | Admit: 2022-01-23 | Discharge: 2022-01-23 | Disposition: A | Discharge status: Home / Self Care | Payer: BC Managed Care – PPO | Source: Ambulatory Visit | Attending: Family Medicine | Admitting: Family Medicine

## 2022-01-23 DIAGNOSIS — R911 Solitary pulmonary nodule: Secondary | ICD-10-CM | POA: Diagnosis not present

## 2022-01-23 DIAGNOSIS — R918 Other nonspecific abnormal finding of lung field: Secondary | ICD-10-CM

## 2022-01-24 DIAGNOSIS — Z01419 Encounter for gynecological examination (general) (routine) without abnormal findings: Secondary | ICD-10-CM | POA: Diagnosis not present

## 2022-01-24 DIAGNOSIS — Z6824 Body mass index (BMI) 24.0-24.9, adult: Secondary | ICD-10-CM | POA: Diagnosis not present

## 2022-01-26 ENCOUNTER — Other Ambulatory Visit: Payer: Self-pay | Admitting: Family Medicine

## 2022-01-26 DIAGNOSIS — R918 Other nonspecific abnormal finding of lung field: Secondary | ICD-10-CM

## 2022-01-31 ENCOUNTER — Ambulatory Visit (INDEPENDENT_AMBULATORY_CARE_PROVIDER_SITE_OTHER): Payer: BC Managed Care – PPO | Admitting: Emergency Medicine

## 2022-01-31 ENCOUNTER — Encounter: Payer: Self-pay | Admitting: Emergency Medicine

## 2022-01-31 VITALS — BP 132/76 | HR 65 | Temp 97.7°F | Ht 64.0 in | Wt 142.0 lb

## 2022-01-31 DIAGNOSIS — E041 Nontoxic single thyroid nodule: Secondary | ICD-10-CM

## 2022-01-31 DIAGNOSIS — R9389 Abnormal findings on diagnostic imaging of other specified body structures: Secondary | ICD-10-CM

## 2022-01-31 NOTE — Assessment & Plan Note (Signed)
Reviewed CT chest with her today.  She has a lobulated rounded right upper lobe mass lesion, smooth margins.  He does have some benign characteristics but is also enlarging.  Question bronchocele, question carcinoid tumor or other benign tumor.  Certainly malignancy is also a possibility although the pace of change on serial imaging makes this less likely.  We will arrange for bronchoscopy to further evaluate. ? ?We will work on arranging bronchoscopy to further evaluate your right lung nodule.  This would be done under general anesthesia as an outpatient at [facility] endoscopy.  You will need a designated driver and someone to watch you at home after the procedure.  We will try to get this arranged for 4/17 or 4/24. ?Follow with Dr Lamonte Sakai in 1 month or next available ?

## 2022-01-31 NOTE — Patient Instructions (Signed)
We will work on arranging bronchoscopy to further evaluate your right lung nodule.  This would be done under general anesthesia as an outpatient at [facility] endoscopy.  You will need a designated driver and someone to watch you at home after the procedure.  We will try to get this arranged for 4/17 or 4/24. ?We will arrange for a thyroid ultrasound ?Follow with Dr Lamonte Sakai in 1 month or next available ? ?

## 2022-01-31 NOTE — Addendum Note (Signed)
Addended by: Gavin Potters R on: 01/31/2022 11:29 AM ? ? Modules accepted: Orders ? ?

## 2022-01-31 NOTE — H&P (View-Only) (Signed)
? ?Subjective:  ? ? Patient ID: Christina [name], female    DOB: November 06, 1965, 56 y.o.   MRN: [id number] ? ?HPI ?56 year old woman with a history of former tobacco use (~5 pack years), allergic rhinitis, COVID-17 October 2021.  She also has a history of an abnormal CT scan of the chest, 2021 with a lesion in the right upper lobe that was felt to be a bronchocele.  Repeat scan done 01/23/2022 as below ? ?She is not having any respiratory sx. She does not cough. No sputum or hemoptysis. She is active, exercises.  ? ?CT chest 01/23/2022 reviewed by me, shows an interval increase in size of a lobulated right upper lobe mass, now 3.5 x 4.5 cm. Also an enlarging thyroid nodule.  ? ? ?Review of Systems ?As per HPI ? ?Past Medical History:  ?Diagnosis Date  ? Allergy   ? Hx of adenomatous and sessile serrated colonic polyps 03/19/2015  ?  ? ?Family History  ?Problem Relation Age of Onset  ? Bladder Cancer Mother   ? Hypertension Mother   ? Hyperlipidemia Mother   ? Colon cancer Father 45  ? Cancer Father 67  ?     colon cancer   ? Colon cancer Maternal Grandmother   ? Heart disease Maternal Grandmother   ? Bipolar disorder Sister   ? Heart disease Brother 58  ?     Heart Attack  ? Alcohol abuse Brother   ? Bipolar disorder Sister   ? Depression Maternal Uncle   ? Cancer Paternal Uncle   ? Cancer Paternal Grandmother   ?     Breast cancer   ?  ? ?Social History  ? ?Socioeconomic History  ? Marital status: Married  ?  Spouse name: Not on file  ? Number of children: Not on file  ? Years of education: Not on file  ? Highest education level: Not on file  ?Occupational History  ? Not on file  ?Tobacco Use  ? Smoking status: Former  ?  Types: Cigarettes  ?  Quit date: 03/18/2002  ?  Years since quitting: 19.8  ? Smokeless tobacco: Never  ?Vaping Use  ? Vaping Use: Never used  ?Substance and Sexual Activity  ? Alcohol use: Yes  ?  Alcohol/week: 4.0 standard drinks  ?  Types: 4 Glasses of wine per week  ?  Comment: 4-5 drinks per week per pt   ? Drug use: No  ? Sexual activity: Not on file  ?Other Topics Concern  ? Not on file  ?Social History Narrative  ? Not on file  ? ?Social Determinants of Health  ? ?Financial Resource Strain: Not on file  ?Food Insecurity: Not on file  ?Transportation Needs: Not on file  ?Physical Activity: Not on file  ?Stress: Not on file  ?Social Connections: Not on file  ?Intimate Partner Violence: Not on file  ?  ? ?No Known Allergies  ? ?Outpatient Medications Prior to Visit  ?Medication Sig Dispense Refill  ? ALPRAZolam (XANAX) 0.25 MG tablet TAKE 1 TABLET BY MOUTH TWICE A DAY AS NEEDED FOR ANXIETY 60 tablet 1  ? B Complex Vitamins (B COMPLEX 50 PO) Take by mouth daily.    ? cholecalciferol (VITAMIN D) 1000 units tablet Take 1,000 Units by mouth daily.    ? escitalopram (LEXAPRO) 5 MG tablet TAKE 1 TABLET BY MOUTH EVERYDAY AT BEDTIME (Patient taking differently: 2.5 mg. TAKE 1/2 TABLET BY MOUTH EVERYDAY AT BEDTIME) 90 tablet 3  ? ibuprofen (  ADVIL,MOTRIN) 200 MG tablet Take 200 mg by mouth every 6 (six) hours as needed.    ? meloxicam (MOBIC) 15 MG tablet TAKE 1 TABLET BY MOUTH EVERY DAY AS NEEDED FOR PAIN 30 tablet 3  ? triamcinolone ointment (KENALOG) 0.1 % APPLY TO AFFECTED AREA TWICE A DAY    ? valACYclovir (VALTREX) 1000 MG tablet SMARTSIG:1 Tablet(s) By Mouth Every 12 Hours    ? ?No facility-administered medications prior to visit.  ? ? ? ?   ?Objective:  ? Physical Exam ?Vitals:  ? 01/31/22 1041  ?BP: 132/76  ?Pulse: 65  ?Temp: 97.7 ?F (36.5 ?C)  ?TempSrc: Oral  ?SpO2: 99%  ?Weight: 142 lb (64.4 kg)  ?Height: '5\' 4"'$  (1.626 m)  ? ?Gen: Pleasant, well-nourished, in no distress,  normal affect ? ?ENT: No lesions,  mouth clear,  oropharynx clear, no postnasal drip ? ?Neck: No JVD, no stridor ? ?Lungs: No use of accessory muscles, no crackles or wheezing on normal respiration, no wheeze on forced expiration ? ?Cardiovascular: RRR, heart sounds normal, no murmur or gallops, no peripheral edema ? ?Musculoskeletal: No  deformities, no cyanosis or clubbing ? ?Neuro: alert, awake, non focal ? ?Skin: Warm, no lesions or rash ? ? ?   ?Assessment & Plan:  ?Abnormal CT of the chest ?Reviewed CT chest with her today.  She has a lobulated rounded right upper lobe mass lesion, smooth margins.  He does have some benign characteristics but is also enlarging.  Question bronchocele, question carcinoid tumor or other benign tumor.  Certainly malignancy is also a possibility although the pace of change on serial imaging makes this less likely.  We will arrange for bronchoscopy to further evaluate. ? ?We will work on arranging bronchoscopy to further evaluate your right lung nodule.  This would be done under general anesthesia as an outpatient at [facility] endoscopy.  You will need a designated driver and someone to watch you at home after the procedure.  We will try to get this arranged for 4/17 or 4/24. ?Follow with Dr Lamonte Sakai in 1 month or next available ? ?Thyroid nodule ?Slightly enlarging on serial imaging going back to 2021.  Will arrange for ultrasound to guide [place] with regard to whether biopsy may be indicated.   ? ?Baltazar Apo, MD, PhD ?01/31/2022, 11:22 AM ? Pulmonary [facility] ?[phone] or if no answer before 7:00PM call [phone] ?For any issues after 7:00PM please call eLink [phone] ? ? ?

## 2022-01-31 NOTE — Progress Notes (Signed)
? ?Subjective:  ? ? Patient ID: Christina [name], female    DOB: 1966/02/28, 56 y.o.   MRN: [id number] ? ?HPI ?56 year old woman with a history of former tobacco use (~5 pack years), allergic rhinitis, COVID-17 October 2021.  She also has a history of an abnormal CT scan of the chest, 2021 with a lesion in the right upper lobe that was felt to be a bronchocele.  Repeat scan done 01/23/2022 as below ? ?She is not having any respiratory sx. She does not cough. No sputum or hemoptysis. She is active, exercises.  ? ?CT chest 01/23/2022 reviewed by me, shows an interval increase in size of a lobulated right upper lobe mass, now 3.5 x 4.5 cm. Also an enlarging thyroid nodule.  ? ? ?Review of Systems ?As per HPI ? ?Past Medical History:  ?Diagnosis Date  ? Allergy   ? Hx of adenomatous and sessile serrated colonic polyps 03/19/2015  ?  ? ?Family History  ?Problem Relation Age of Onset  ? Bladder Cancer Mother   ? Hypertension Mother   ? Hyperlipidemia Mother   ? Colon cancer Father 25  ? Cancer Father 67  ?     colon cancer   ? Colon cancer Maternal Grandmother   ? Heart disease Maternal Grandmother   ? Bipolar disorder Sister   ? Heart disease Brother 61  ?     Heart Attack  ? Alcohol abuse Brother   ? Bipolar disorder Sister   ? Depression Maternal Uncle   ? Cancer Paternal Uncle   ? Cancer Paternal Grandmother   ?     Breast cancer   ?  ? ?Social History  ? ?Socioeconomic History  ? Marital status: Married  ?  Spouse name: Not on file  ? Number of children: Not on file  ? Years of education: Not on file  ? Highest education level: Not on file  ?Occupational History  ? Not on file  ?Tobacco Use  ? Smoking status: Former  ?  Types: Cigarettes  ?  Quit date: 03/18/2002  ?  Years since quitting: 19.8  ? Smokeless tobacco: Never  ?Vaping Use  ? Vaping Use: Never used  ?Substance and Sexual Activity  ? Alcohol use: Yes  ?  Alcohol/week: 4.0 standard drinks  ?  Types: 4 Glasses of wine per week  ?  Comment: 4-5 drinks per week per pt   ? Drug use: No  ? Sexual activity: Not on file  ?Other Topics Concern  ? Not on file  ?Social History Narrative  ? Not on file  ? ?Social Determinants of Health  ? ?Financial Resource Strain: Not on file  ?Food Insecurity: Not on file  ?Transportation Needs: Not on file  ?Physical Activity: Not on file  ?Stress: Not on file  ?Social Connections: Not on file  ?Intimate Partner Violence: Not on file  ?  ? ?No Known Allergies  ? ?Outpatient Medications Prior to Visit  ?Medication Sig Dispense Refill  ? ALPRAZolam (XANAX) 0.25 MG tablet TAKE 1 TABLET BY MOUTH TWICE A DAY AS NEEDED FOR ANXIETY 60 tablet 1  ? B Complex Vitamins (B COMPLEX 50 PO) Take by mouth daily.    ? cholecalciferol (VITAMIN D) 1000 units tablet Take 1,000 Units by mouth daily.    ? escitalopram (LEXAPRO) 5 MG tablet TAKE 1 TABLET BY MOUTH EVERYDAY AT BEDTIME (Patient taking differently: 2.5 mg. TAKE 1/2 TABLET BY MOUTH EVERYDAY AT BEDTIME) 90 tablet 3  ? ibuprofen (  ADVIL,MOTRIN) 200 MG tablet Take 200 mg by mouth every 6 (six) hours as needed.    ? meloxicam (MOBIC) 15 MG tablet TAKE 1 TABLET BY MOUTH EVERY DAY AS NEEDED FOR PAIN 30 tablet 3  ? triamcinolone ointment (KENALOG) 0.1 % APPLY TO AFFECTED AREA TWICE A DAY    ? valACYclovir (VALTREX) 1000 MG tablet SMARTSIG:1 Tablet(s) By Mouth Every 12 Hours    ? ?No facility-administered medications prior to visit.  ? ? ? ?   ?Objective:  ? Physical Exam ?Vitals:  ? 01/31/22 1041  ?BP: 132/76  ?Pulse: 65  ?Temp: 97.7 ?F (36.5 ?C)  ?TempSrc: Oral  ?SpO2: 99%  ?Weight: 142 lb (64.4 kg)  ?Height: '5\' 4"'$  (1.626 m)  ? ?Gen: Pleasant, well-nourished, in no distress,  normal affect ? ?ENT: No lesions,  mouth clear,  oropharynx clear, no postnasal drip ? ?Neck: No JVD, no stridor ? ?Lungs: No use of accessory muscles, no crackles or wheezing on normal respiration, no wheeze on forced expiration ? ?Cardiovascular: RRR, heart sounds normal, no murmur or gallops, no peripheral edema ? ?Musculoskeletal: No  deformities, no cyanosis or clubbing ? ?Neuro: alert, awake, non focal ? ?Skin: Warm, no lesions or rash ? ? ?   ?Assessment & Plan:  ?Abnormal CT of the chest ?Reviewed CT chest with her today.  She has a lobulated rounded right upper lobe mass lesion, smooth margins.  He does have some benign characteristics but is also enlarging.  Question bronchocele, question carcinoid tumor or other benign tumor.  Certainly malignancy is also a possibility although the pace of change on serial imaging makes this less likely.  We will arrange for bronchoscopy to further evaluate. ? ?We will work on arranging bronchoscopy to further evaluate your right lung nodule.  This would be done under general anesthesia as an outpatient at [facility] endoscopy.  You will need a designated driver and someone to watch you at home after the procedure.  We will try to get this arranged for 4/17 or 4/24. ?Follow with Dr Lamonte Sakai in 1 month or next available ? ?Thyroid nodule ?Slightly enlarging on serial imaging going back to 2021.  Will arrange for ultrasound to guide [place] with regard to whether biopsy may be indicated.   ? ?Baltazar Apo, MD, PhD ?01/31/2022, 11:22 AM ?[place] [facility] ?[phone] or if no answer before 7:00PM call [phone] ?For any issues after 7:00PM please call eLink [phone] ? ? ?

## 2022-01-31 NOTE — Assessment & Plan Note (Signed)
Slightly enlarging on serial imaging going back to 2021.  Will arrange for ultrasound to guide [place] with regard to whether biopsy may be indicated.   ?

## 2022-02-01 NOTE — Addendum Note (Signed)
Addended by: Collene Gobble on: 02/01/2022 09:03 AM ? ? Modules accepted: Orders ? ?

## 2022-02-02 ENCOUNTER — Other Ambulatory Visit: Payer: BC Managed Care – PPO

## 2022-02-03 ENCOUNTER — Other Ambulatory Visit: Payer: BC Managed Care – PPO

## 2022-02-06 ENCOUNTER — Other Ambulatory Visit: Payer: BC Managed Care – PPO

## 2022-02-08 ENCOUNTER — Telehealth: Payer: Self-pay | Admitting: Emergency Medicine

## 2022-02-08 NOTE — Telephone Encounter (Signed)
Pt is scheduled for bronch on 4/17.  She called this morning and states her husband has training at work that week, she has 54 year-old with cerebral palsey and a child in school.  She checked with her sister and she can't help that day and she is going to need to reschedule.  Dr Lamonte Sakai - where do you want me to reschedule her to? ?

## 2022-02-08 NOTE — Telephone Encounter (Signed)
Thank you :)

## 2022-02-08 NOTE — Telephone Encounter (Signed)
Try 5/1 ?

## 2022-02-08 NOTE — Telephone Encounter (Signed)
Pt has been rescheduled for 5/1 at 7:30.  Spoke to pt and gave her appt info.   ?

## 2022-02-13 ENCOUNTER — Ambulatory Visit
Admission: RE | Admit: 2022-02-13 | Discharge: 2022-02-13 | Disposition: A | Discharge status: Home / Self Care | Payer: BC Managed Care – PPO | Source: Ambulatory Visit | Attending: Emergency Medicine | Admitting: Emergency Medicine

## 2022-02-13 DIAGNOSIS — E041 Nontoxic single thyroid nodule: Secondary | ICD-10-CM | POA: Diagnosis not present

## 2022-02-23 ENCOUNTER — Encounter (HOSPITAL_COMMUNITY): Payer: Self-pay | Admitting: Emergency Medicine

## 2022-02-24 ENCOUNTER — Encounter (HOSPITAL_COMMUNITY): Payer: Self-pay | Admitting: Emergency Medicine

## 2022-02-24 ENCOUNTER — Other Ambulatory Visit: Payer: Self-pay | Admitting: Emergency Medicine

## 2022-02-24 LAB — SARS CORONAVIRUS 2 (TAT 6-24 HRS): SARS Coronavirus 2: NEGATIVE

## 2022-02-24 NOTE — Progress Notes (Signed)
Unable to reach patient via phone.  Left a detailed message on machine with instructions for DOS. ? ?TWO VISITORS ARE ALLOWED TO COME WITH YOU AND STAY IN THE SURGICAL WAITING ROOM ONLY DURING PRE OP AND PROCEDURE DAY OF SURGERY.  ? ?PCP - Dr Jenna Luo ?Cardiologist - n/a ? ?CT Chest x-ray - 01/23/22 ?EKG - n/a ?Stress Test - n/a ?ECHO - n/a ?Cardiac Cath - n/a ? ?ICD Pacemaker/Loop - n/a ? ?Sleep Study -  n/a ?CPAP - none ? ?STOP now taking any Aspirin (unless otherwise instructed by your surgeon), Aleve, Naproxen, Ibuprofen, Motrin, Advil, Goody's, BC's, all herbal medications, fish oil, and all vitamins.  ? ?Coronavirus Screening ?Covid Test on DOS ?

## 2022-02-26 NOTE — Anesthesia Preprocedure Evaluation (Addendum)
Anesthesia Evaluation  ?Patient identified by MRN, date of birth, ID band ?Patient awake ? ? ? ?Reviewed: ?Allergy & Precautions, NPO status , Patient's Chart, lab work & pertinent test results ? ?Airway ?Mallampati: II ? ?TM Distance: >3 FB ?Neck ROM: Full ? ? ? Dental ?no notable dental hx. ? ?  ?Pulmonary ?former smoker,  ?RUL nodule ?  ? ?+ decreased breath sounds ? ? ? ? ? Cardiovascular ?negative cardio ROS ? ? ?Rhythm:Regular Rate:Normal ? ? ?  ?Neuro/Psych ?Anxiety Depression negative neurological ROS ?   ? GI/Hepatic ?Neg liver ROS, GERD  ,  ?Endo/Other  ?negative endocrine ROS ? Renal/GU ?negative Renal ROS  ?negative genitourinary ?  ?Musculoskeletal ?negative musculoskeletal ROS ?(+)  ? Abdominal ?Normal abdominal exam  (+)   ?Peds ? Hematology ?negative hematology ROS ?(+)   ?Anesthesia Other Findings ? ? Reproductive/Obstetrics ? ?  ? ? ? ? ? ? ? ? ? ? ? ? ? ?  ?  ? ? ? ? ? ? ? ?Anesthesia Physical ?Anesthesia Plan ? ?ASA: 3 ? ?Anesthesia Plan: General  ? ?Post-op Pain Management:   ? ?Induction: Intravenous ? ?PONV Risk Score and Plan: 3 and Ondansetron, Dexamethasone, Midazolam and Treatment may vary due to age or medical condition ? ?Airway Management Planned: Mask and Oral ETT ? ?Additional Equipment: None ? ?Intra-op Plan:  ? ?Post-operative Plan: Extubation in OR ? ?Informed Consent: I have reviewed the patients History and Physical, chart, labs and discussed the procedure including the risks, benefits and alternatives for the proposed anesthesia with the patient or authorized representative who has indicated his/her understanding and acceptance.  ? ? ? ?Dental advisory given ? ?Plan Discussed with: CRNA ? ?Anesthesia Plan Comments: (Lab Results ?     Component                Value               Date                 ?     WBC                      3.8 (L)             02/27/2022           ?     HGB                      12.3                02/27/2022           ?      HCT                      37.4                02/27/2022           ?     MCV                      98.9                02/27/2022           ?     PLT                      172  02/27/2022           ?Lab Results ?     Component                Value               Date                 ?     NA                       139                 09/08/2021           ?     K                        4.2                 09/08/2021           ?     CO2                      29                  09/08/2021           ?     GLUCOSE                  84                  09/08/2021           ?     BUN                      10                  09/08/2021           ?     CREATININE               0.73                09/08/2021           ?     CALCIUM                  9.6                 09/08/2021           ?     EGFR                     98                  09/08/2021          )  ? ? ? ? ? ?Anesthesia Quick Evaluation ? ?

## 2022-02-27 ENCOUNTER — Encounter (HOSPITAL_COMMUNITY)
Admission: RE | Disposition: A | Discharge status: Home / Self Care | Payer: Self-pay | Source: Ambulatory Visit | Attending: Emergency Medicine

## 2022-02-27 ENCOUNTER — Ambulatory Visit (HOSPITAL_COMMUNITY): Payer: BC Managed Care – PPO

## 2022-02-27 ENCOUNTER — Other Ambulatory Visit: Payer: Self-pay

## 2022-02-27 ENCOUNTER — Ambulatory Visit (HOSPITAL_COMMUNITY): Payer: BC Managed Care – PPO | Admitting: Anesthesiology

## 2022-02-27 ENCOUNTER — Encounter (HOSPITAL_COMMUNITY): Payer: Self-pay | Admitting: Emergency Medicine

## 2022-02-27 ENCOUNTER — Ambulatory Visit (HOSPITAL_COMMUNITY)
Admission: RE | Admit: 2022-02-27 | Discharge: 2022-02-27 | Disposition: A | Discharge status: Home / Self Care | Payer: BC Managed Care – PPO | Source: Ambulatory Visit | Attending: Emergency Medicine | Admitting: Emergency Medicine

## 2022-02-27 DIAGNOSIS — R9389 Abnormal findings on diagnostic imaging of other specified body structures: Secondary | ICD-10-CM

## 2022-02-27 DIAGNOSIS — R911 Solitary pulmonary nodule: Secondary | ICD-10-CM | POA: Diagnosis not present

## 2022-02-27 DIAGNOSIS — Z87891 Personal history of nicotine dependence: Secondary | ICD-10-CM | POA: Diagnosis not present

## 2022-02-27 DIAGNOSIS — F419 Anxiety disorder, unspecified: Secondary | ICD-10-CM | POA: Diagnosis not present

## 2022-02-27 DIAGNOSIS — R918 Other nonspecific abnormal finding of lung field: Secondary | ICD-10-CM | POA: Insufficient documentation

## 2022-02-27 DIAGNOSIS — F32A Depression, unspecified: Secondary | ICD-10-CM | POA: Insufficient documentation

## 2022-02-27 DIAGNOSIS — J984 Other disorders of lung: Secondary | ICD-10-CM | POA: Diagnosis not present

## 2022-02-27 DIAGNOSIS — F418 Other specified anxiety disorders: Secondary | ICD-10-CM | POA: Diagnosis not present

## 2022-02-27 DIAGNOSIS — J309 Allergic rhinitis, unspecified: Secondary | ICD-10-CM | POA: Diagnosis not present

## 2022-02-27 DIAGNOSIS — Z8616 Personal history of COVID-19: Secondary | ICD-10-CM | POA: Diagnosis not present

## 2022-02-27 DIAGNOSIS — E041 Nontoxic single thyroid nodule: Secondary | ICD-10-CM | POA: Insufficient documentation

## 2022-02-27 HISTORY — DX: Other nonspecific abnormal finding of lung field: R91.8

## 2022-02-27 HISTORY — PX: BRONCHIAL BIOPSY: SHX5109

## 2022-02-27 HISTORY — PX: BRONCHIAL BRUSHINGS: SHX5108

## 2022-02-27 HISTORY — PX: VIDEO BRONCHOSCOPY WITH RADIAL ENDOBRONCHIAL ULTRASOUND: SHX6849

## 2022-02-27 HISTORY — PX: BRONCHIAL NEEDLE ASPIRATION BIOPSY: SHX5106

## 2022-02-27 HISTORY — DX: Anxiety disorder, unspecified: F41.9

## 2022-02-27 HISTORY — PX: ENDOBRONCHIAL ULTRASOUND: SHX5096

## 2022-02-27 LAB — CBC
HCT: 37.4 % (ref 36.0–46.0)
Hemoglobin: 12.3 g/dL (ref 12.0–15.0)
MCH: 32.5 pg (ref 26.0–34.0)
MCHC: 32.9 g/dL (ref 30.0–36.0)
MCV: 98.9 fL (ref 80.0–100.0)
Platelets: 172 10*3/uL (ref 150–400)
RBC: 3.78 MIL/uL — ABNORMAL LOW (ref 3.87–5.11)
RDW: 12.6 % (ref 11.5–15.5)
WBC: 3.8 10*3/uL — ABNORMAL LOW (ref 4.0–10.5)
nRBC: 0 % (ref 0.0–0.2)

## 2022-02-27 SURGERY — BRONCHOSCOPY, WITH BIOPSY USING ELECTROMAGNETIC NAVIGATION
Anesthesia: General

## 2022-02-27 MED ORDER — SUGAMMADEX SODIUM 200 MG/2ML IV SOLN
INTRAVENOUS | Status: DC | PRN
  Administered 2022-02-27: 200 mg via INTRAVENOUS

## 2022-02-27 MED ORDER — ALPRAZOLAM 0.25 MG PO TABS: 0.2500 mg | ORAL_TABLET | ORAL | Status: DC

## 2022-02-27 MED ORDER — FENTANYL CITRATE (PF) 100 MCG/2ML IJ SOLN
INTRAMUSCULAR | Status: AC
  Filled 2022-02-27: qty 2

## 2022-02-27 MED ORDER — PHENYLEPHRINE 80 MCG/ML (10ML) SYRINGE FOR IV PUSH (FOR BLOOD PRESSURE SUPPORT)
PREFILLED_SYRINGE | INTRAVENOUS | Status: DC | PRN
  Administered 2022-02-27 (×3): 80 ug via INTRAVENOUS
  Administered 2022-02-27: 160 ug via INTRAVENOUS

## 2022-02-27 MED ORDER — CHLORHEXIDINE GLUCONATE 0.12 % MT SOLN: 15.0000 mL | Freq: Once | OROMUCOSAL | Status: AC

## 2022-02-27 MED ORDER — ROCURONIUM BROMIDE 10 MG/ML (PF) SYRINGE
PREFILLED_SYRINGE | INTRAVENOUS | Status: DC | PRN
Start: 2022-02-27 — End: 2022-02-27
  Administered 2022-02-27 (×2): 20 mg via INTRAVENOUS
  Administered 2022-02-27: 50 mg via INTRAVENOUS

## 2022-02-27 MED ORDER — ACETAMINOPHEN 10 MG/ML IV SOLN
1000.0000 mg | Freq: Once | INTRAVENOUS | Status: DC | PRN
  Filled 2022-02-27: qty 100

## 2022-02-27 MED ORDER — LIDOCAINE 2% (20 MG/ML) 5 ML SYRINGE
INTRAMUSCULAR | Status: DC | PRN
  Administered 2022-02-27: 60 mg via INTRAVENOUS

## 2022-02-27 MED ORDER — CHLORHEXIDINE GLUCONATE 0.12 % MT SOLN
OROMUCOSAL | Status: AC
  Administered 2022-02-27: 15 mL via OROMUCOSAL
  Filled 2022-02-27: qty 15

## 2022-02-27 MED ORDER — LACTATED RINGERS IV SOLN: INTRAVENOUS | Status: DC

## 2022-02-27 MED ORDER — FENTANYL CITRATE (PF) 100 MCG/2ML IJ SOLN: 25.0000 ug | INTRAMUSCULAR | Status: DC | PRN

## 2022-02-27 MED ORDER — ONDANSETRON HCL 4 MG/2ML IJ SOLN
INTRAMUSCULAR | Status: DC | PRN
  Administered 2022-02-27: 4 mg via INTRAVENOUS

## 2022-02-27 MED ORDER — FENTANYL CITRATE (PF) 250 MCG/5ML IJ SOLN
INTRAMUSCULAR | Status: DC | PRN
  Administered 2022-02-27: 100 ug via INTRAVENOUS

## 2022-02-27 MED ORDER — MIDAZOLAM HCL 2 MG/2ML IJ SOLN
INTRAMUSCULAR | Status: AC
  Filled 2022-02-27: qty 2

## 2022-02-27 MED ORDER — MIDAZOLAM HCL 2 MG/2ML IJ SOLN
INTRAMUSCULAR | Status: DC | PRN
  Administered 2022-02-27: 2 mg via INTRAVENOUS

## 2022-02-27 MED ORDER — DEXAMETHASONE SODIUM PHOSPHATE 10 MG/ML IJ SOLN
INTRAMUSCULAR | Status: DC | PRN
Start: 2022-02-27 — End: 2022-02-27
  Administered 2022-02-27: 10 mg via INTRAVENOUS

## 2022-02-27 MED ORDER — PROPOFOL 10 MG/ML IV BOLUS
INTRAVENOUS | Status: DC | PRN
  Administered 2022-02-27: 130 mg via INTRAVENOUS

## 2022-02-27 NOTE — Transfer of Care (Signed)
Immediate Anesthesia Transfer of Care Note ? ?Patient: Christina [name] ? ?Procedure(s) Performed: ROBOTIC ASSISTED NAVIGATIONAL BRONCHOSCOPY ?BRONCHIAL NEEDLE ASPIRATION BIOPSIES ?BRONCHIAL BIOPSIES ?VIDEO BRONCHOSCOPY WITH RADIAL ENDOBRONCHIAL ULTRASOUND ?BRONCHIAL BRUSHINGS ?ENDOBRONCHIAL ULTRASOUND ? ?Patient Location: PACU ? ?Anesthesia Type:General ? ?Level of Consciousness: drowsy and patient cooperative ? ?Airway & Oxygen Therapy: Patient Spontanous Breathing and Patient connected to nasal cannula oxygen ? ?Post-op Assessment: Report given to RN and Post -op Vital signs reviewed and stable ? ?Post vital signs: Reviewed and stable ? ?Last Vitals:  ?Vitals Value Taken Time  ?BP 96/54 02/27/22 0940  ?Temp 36.5 ?C 02/27/22 0940  ?Pulse 66 02/27/22 0942  ?Resp 21 02/27/22 0945  ?SpO2 90 % 02/27/22 0942  ?Vitals shown include unvalidated device data. ? ?Last Pain:  ?Vitals:  ? 02/27/22 0623  ?TempSrc:   ?PainSc: 0-No pain  ?   ? ?  ? ?Complications: No notable events documented. ?

## 2022-02-27 NOTE — Anesthesia Postprocedure Evaluation (Signed)
Anesthesia Post Note ? ?Patient: Christina [name] ? ?Procedure(s) Performed: ROBOTIC ASSISTED NAVIGATIONAL BRONCHOSCOPY ?BRONCHIAL NEEDLE ASPIRATION BIOPSIES ?BRONCHIAL BIOPSIES ?VIDEO BRONCHOSCOPY WITH RADIAL ENDOBRONCHIAL ULTRASOUND ?BRONCHIAL BRUSHINGS ?ENDOBRONCHIAL ULTRASOUND ? ?  ? ?Patient location during evaluation: PACU ?Anesthesia Type: General ?Level of consciousness: awake and alert ?Pain management: pain level controlled ?Vital Signs Assessment: post-procedure vital signs reviewed and stable ?Respiratory status: spontaneous breathing, nonlabored ventilation, respiratory function stable and patient connected to nasal cannula oxygen ?Cardiovascular status: blood pressure returned to baseline and stable ?Postop Assessment: no apparent nausea or vomiting ?Anesthetic complications: no ? ? ?No notable events documented. ? ?Last Vitals:  ?Vitals:  ? 02/27/22 0955 02/27/22 1010  ?BP: (!) 110/55 (!) 101/57  ?Pulse: 73 72  ?Resp: 20 17  ?Temp:  37.6 ?C  ?SpO2: 96% 93%  ?  ?Last Pain:  ?Vitals:  ? 02/27/22 1010  ?TempSrc:   ?PainSc: 0-No pain  ? ? ?  ?  ?  ?  ?  ?  ? ?March Rummage Christina [name] ? ? ? ? ?

## 2022-02-27 NOTE — Discharge Instructions (Signed)
Flexible Bronchoscopy, Care After ?This sheet gives you information about how to care for yourself after your test. Your doctor may also give you more specific instructions. If you have problems or questions, contact your doctor. ?Follow these instructions at home: ?Eating and drinking ?Do not eat or drink anything (not even water) for 2 hours after your test, or until your numbing medicine (local anesthetic) wears off. ?When your numbness is gone and your cough and gag reflexes have come back, you may: ?Eat only soft foods. ?Slowly drink liquids. ?The day after the test, go back to your normal diet. ?Driving ?Do not drive for 24 hours if you were given a medicine to help you relax (sedative). ?Do not drive or use heavy machinery while taking prescription pain medicine. ?General instructions ? ?Take over-the-counter and prescription medicines only as told by your doctor. ?Return to your normal activities as told. Ask what activities are safe for you. ?Do not use any products that have nicotine or tobacco in them. This includes cigarettes and e-cigarettes. If you need help quitting, ask your doctor. ?Keep all follow-up visits as told by your doctor. This is important. It is very important if you had a tissue sample (biopsy) taken. ?Get help right away if: ?You have shortness of breath that gets worse. ?You get light-headed. ?You feel like you are going to pass out (faint). ?You have chest pain. ?You cough up: ?More than a little blood. ?More blood than before. ?Summary ?Do not eat or drink anything (not even water) for 2 hours after your test, or until your numbing medicine wears off. ?Do not use cigarettes. Do not use e-cigarettes. ?Get help right away if you have chest pain. ? ?Please call our office for any questions or concerns.  [phone]. ? ?This information is not intended to replace advice given to you by your health care provider. Make sure you discuss any questions you have with your health care  provider. ?Document Released: 08/13/2009 Document Revised: 09/28/2017 Document Reviewed: 11/03/2016 ?Elsevier Patient Education ? [place]. ? ?

## 2022-02-27 NOTE — Anesthesia Procedure Notes (Signed)
Procedure Name: Intubation ?Date/Time: 02/27/2022 7:42 AM ?Performed by: Thelma Comp, CRNA ?Pre-anesthesia Checklist: Patient identified, Emergency Drugs available, Suction available and Patient being monitored ?Patient Re-evaluated:Patient Re-evaluated prior to induction ?Oxygen Delivery Method: Circle System Utilized ?Preoxygenation: Pre-oxygenation with 100% oxygen ?Induction Type: IV induction ?Ventilation: Mask ventilation without difficulty ?Laryngoscope Size: Mac and 3 ?Grade View: Grade I ?Tube type: Oral ?Tube size: 8.5 mm ?Number of attempts: 1 ?Airway Equipment and Method: Stylet ?Placement Confirmation: ETT inserted through vocal cords under direct vision, positive ETCO2 and breath sounds checked- equal and bilateral ?Secured at: 21 cm ?Tube secured with: Tape ?Dental Injury: Teeth and Oropharynx as per pre-operative assessment  ? ? ? ? ?

## 2022-02-27 NOTE — Interval H&P Note (Signed)
History and Physical Interval Note: ? ?02/27/2022 ?7:35 AM ? ?Christina [name]  has presented today for surgery, with the diagnosis of RIGHT UPPER LOPE MASS.  The various methods of treatment have been discussed with the patient and family. After consideration of risks, benefits and other options for treatment, the patient has consented to  Procedure(s): ?ROBOTIC ASSISTED NAVIGATIONAL BRONCHOSCOPY (N/A) as a surgical intervention.  The patient's history has been reviewed, patient examined, no change in status, stable for surgery.  I have reviewed the patient's chart and labs.  Questions were answered to the patient's satisfaction.   ? ? ?Rose Fillers Artis Beggs ? ? ?

## 2022-02-27 NOTE — Op Note (Signed)
Video Bronchoscopy with Robotic Assisted Bronchoscopic Navigation and Endobronchial Ultrasound Procedure Note ? ?Date of Operation: 02/27/2022  ? ?Pre-op Diagnosis: Right upper lobe mass ? ?Post-op Diagnosis: Same ? ? ?Surgeon: Baltazar Apo ? ?Assistants: None ? ?Anesthesia: General endotracheal anesthesia ? ?Operation: Flexible video fiberoptic bronchoscopy with robotic assistance and biopsies. ? ?Estimated Blood Loss: Minimal ? ?Complications: None ? ?Indications and History: ?Christina [name] is a 56 y.o. female with history of minimal tobacco use.  She is noted to have a slowly enlarging lobulated right upper lobe mass on serial CT scans of the chest.  Recommendation made to achieve a tissue diagnosis via navigational bronchoscopy, endobronchial ultrasound and biopsies. The risks, benefits, complications, treatment options and expected outcomes were discussed with the patient.  The possibilities of pneumothorax, pneumonia, reaction to medication, pulmonary aspiration, perforation of a viscus, bleeding, failure to diagnose a condition and creating a complication requiring transfusion or operation were discussed with the patient who freely signed the consent.   ? ?Description of Procedure: ?The patient was seen in the Preoperative Area, was examined and was deemed appropriate to proceed.  The patient was taken to [facility] endoscopy room 3, identified as Christina [name] and the procedure verified as Flexible Video Fiberoptic Bronchoscopy.  A Time Out was held and the above information confirmed.  ? ?Prior to the date of the procedure a high-resolution CT scan of the chest was performed. Utilizing ION software program a virtual tracheobronchial tree was generated to allow the creation of distinct navigation pathways to the patient's parenchymal abnormalities. After being taken to the operating room general anesthesia was initiated and the patient  was orally intubated. The video fiberoptic bronchoscope was introduced via the  endotracheal tube and a general inspection was performed which showed normal right and left lung anatomy, aspiration of the bilateral mainstems was completed to remove any remaining secretions. Robotic catheter inserted into patient's endotracheal tube.  ? ?Target #1 right upper lobe mass: ?The distinct navigation pathways prepared prior to this procedure were then utilized to navigate to patient's lesion identified on CT scan. The robotic catheter was secured into place and the vision probe was withdrawn.  Lesion location was approximated using fluoroscopy and radial endobronchial ultrasound for peripheral targeting.  Local registration and targeting performed using  Cios three-dimensional imaging.  Under fluoroscopic guidance transbronchial needle brushings, transbronchial needle biopsies, and transbronchial forceps biopsies were performed to be sent for cytology and pathology.  Unfortunately based on the location of the lesion there is difficult to introduce instruments effectively.  Needle would not pass through segmental airway and into the lesion cleanly.  For this reason endobronchial ultrasound was performed. ? ?The standard scope was then withdrawn and the endobronchial ultrasound was used to identify and characterize the peritracheal, hilar and bronchial lymph nodes.  The right upper lobe airway was difficult access but could be intermittently entered.  The right upper lobe mass was vascular but could be identified. Using real-time ultrasound guidance Wang needle biopsies were take from Station 10 R (the right upper lobe mass) node and were sent for cytology. The patient tolerated the procedure well without apparent complications. There was no significant blood loss. The bronchoscope was withdrawn. Anesthesia was reversed and the patient was taken to the PACU for recovery.  ? ?At the end of the procedure a general airway inspection was performed and there was no evidence of active bleeding. The  bronchoscope was removed.  The patient tolerated the procedure well. There was no significant blood  loss and there were no obvious complications. A post-procedural chest x-ray is pending. ? ?Samples Target #1: ?1. Transbronchial needle brushings from right upper lobe mass ?2. Transbronchial Wang needle biopsies from right upper lobe mass ?3. Transbronchial forceps biopsies from right upper lobe mass ? ?EBUS Samples: ?1. Wang needle biopsies from 10 R node (right upper lobe mass) ? ? ?Plans:  ?The patient will be discharged from the PACU to home when recovered from anesthesia and after chest x-ray is reviewed. We will review the cytology, pathology and microbiology results with the patient when they become available. Outpatient followup will be with Dr. Lamonte Sakai.  ? ? ?Baltazar Apo, MD, PhD ?02/27/2022, 9:29 AM ?[facility] ?[phone] or if no answer before 7:00PM call [phone] ?For any issues after 7:00PM please call eLink [phone] ? ? ? ? ? ? ?

## 2022-02-28 ENCOUNTER — Encounter (HOSPITAL_COMMUNITY): Payer: Self-pay | Admitting: Emergency Medicine

## 2022-02-28 LAB — CYTOLOGY - NON PAP

## 2022-03-01 ENCOUNTER — Telehealth: Payer: Self-pay

## 2022-03-01 DIAGNOSIS — R9389 Abnormal findings on diagnostic imaging of other specified body structures: Secondary | ICD-10-CM

## 2022-03-01 NOTE — Addendum Note (Signed)
Addended by: Collene Gobble on: 03/01/2022 01:04 PM ? ? Modules accepted: Orders ? ?

## 2022-03-01 NOTE — Telephone Encounter (Signed)
Discussed bronchoscopy results with Allanna by phone today.  Unfortunately even though her right upper lobe mass was accessible the procedure was technically difficult and needle samples were nondiagnostic.  I also tried to access via EBUS, again material was nondiagnostic. ? ?We talked today about the differential diagnosis which includes carcinoid tumor, some other benign pulmonary tumor.  This would be suggested by the rounded lobulated nature, absence of symptoms with a large lesion, difficulty penetrating with a needle.  Even so she knows that this could be a malignant process.  Discussed in detail the options for next steps which would appear to be either transthoracic needle biopsy or primary resection.  Either way we would need to get a PET scan for more information regarding the right upper lobe lesion, mediastinum, any other potential targets before planning another procedure.  I will obtain the PET scan, then review her case with IR and TCTS in thoracic conference.  Based on this we will decide how to proceed.  I will also get pulmonary function testing in case we feel primary resection is going to be required. ? ?She has a left thyroid nodule and needle biopsy was recommended.  We will get the PET scan, better characterize the nodule and plan next steps in the thyroid evaluation. ? ? ?Baltazar Apo, MD, PhD ?03/01/2022, 1:01 PM ?[facility] ?[phone] or if no answer before 7:00PM call [phone] ?For any issues after 7:00PM please call eLink [phone] ? ?

## 2022-03-01 NOTE — Telephone Encounter (Signed)
Spoke with pt to review thyroid US results as dictated by Dr. Lamonte Sakai. Pt began to ask questions that RN not answer regarding bronchoscopy results posted on My Chart. Bronchoscopy was done on 02/27/22. Pt would like Dr. Lamonte Sakai to call her and address these questions. Pt is aware she is schedule for OV on 03/17/22. Pt would like phone call from Dr. Lamonte Sakai prior to that.  ? ?Dr. Lamonte Sakai please advise.  ?

## 2022-03-02 ENCOUNTER — Other Ambulatory Visit: Payer: Self-pay | Admitting: Family Medicine

## 2022-03-02 ENCOUNTER — Encounter: Payer: Self-pay | Admitting: Family Medicine

## 2022-03-02 MED ORDER — ZOLPIDEM TARTRATE 10 MG PO TABS: 10.0000 mg | ORAL_TABLET | Freq: Every evening | ORAL | 1 refills | Status: DC | PRN

## 2022-03-09 ENCOUNTER — Ambulatory Visit (HOSPITAL_COMMUNITY)
Admission: RE | Admit: 2022-03-09 | Discharge: 2022-03-09 | Disposition: A | Discharge status: Home / Self Care | Payer: BC Managed Care – PPO | Source: Ambulatory Visit | Attending: Emergency Medicine | Admitting: Emergency Medicine

## 2022-03-09 ENCOUNTER — Telehealth: Payer: Self-pay | Admitting: Emergency Medicine

## 2022-03-09 ENCOUNTER — Encounter: Payer: Self-pay | Admitting: Emergency Medicine

## 2022-03-09 ENCOUNTER — Encounter: Payer: Self-pay | Admitting: Family Medicine

## 2022-03-09 DIAGNOSIS — R911 Solitary pulmonary nodule: Secondary | ICD-10-CM | POA: Diagnosis not present

## 2022-03-09 DIAGNOSIS — R9389 Abnormal findings on diagnostic imaging of other specified body structures: Secondary | ICD-10-CM | POA: Diagnosis not present

## 2022-03-09 DIAGNOSIS — K5730 Diverticulosis of large intestine without perforation or abscess without bleeding: Secondary | ICD-10-CM | POA: Diagnosis not present

## 2022-03-09 DIAGNOSIS — J984 Other disorders of lung: Secondary | ICD-10-CM | POA: Diagnosis not present

## 2022-03-09 MED ORDER — FLUDEOXYGLUCOSE F - 18 (FDG) INJECTION
7.3900 | Freq: Once | INTRAVENOUS | Status: AC | PRN
  Administered 2022-03-09: 7.39 via INTRAVENOUS

## 2022-03-09 NOTE — Telephone Encounter (Signed)
Dr. Lamonte Sakai, please advise on this as pt is requesting a call about the results prior to her appt. ?

## 2022-03-10 NOTE — Telephone Encounter (Signed)
I reviewed PET with her today. The dominant cystic area does not show any hypermetabolism - consistent w a bronchocele. There is a small 1.5 cm more nodular focus superiorly, question post-obstructive scar vs true nodule. There is mild hypermetabolism and must still consider a possible malignancy. We may still need to biopsy or surgically remove. Certainly it will need to be followed for stability or interval change.  ? ?I will review with Thoracic conference next week. Will discuss further with her next week as well.  ?

## 2022-03-17 ENCOUNTER — Ambulatory Visit (INDEPENDENT_AMBULATORY_CARE_PROVIDER_SITE_OTHER): Payer: BC Managed Care – PPO | Admitting: Emergency Medicine

## 2022-03-17 ENCOUNTER — Encounter: Payer: Self-pay | Admitting: Emergency Medicine

## 2022-03-17 DIAGNOSIS — R9389 Abnormal findings on diagnostic imaging of other specified body structures: Secondary | ICD-10-CM | POA: Diagnosis not present

## 2022-03-17 NOTE — Patient Instructions (Signed)
We reviewed you PET scan in detail today. We will make a referral either to interventional radiology or to thoracic surgery to definitively address your small right upper lobe nodule and bronchogenic cyst.  Please call our office or message [place] next week to let us know which referral should be made. Follow Dr. Lamonte Sakai in 1 month or next available

## 2022-03-17 NOTE — Assessment & Plan Note (Signed)
I reviewed her PET scan with thoracic conference and also with the patient.  2 discrete abnormalities, the known slowly enlarging cystic lesion in the right upper lobe and also a more superior irregularly shaped nodular lesion with moderate hypermetabolism, unclear etiology.  She is low risk for malignancy but certainly cancer remains a possibility.  Reviewed the options with her that include serial imaging, needle biopsy in IR, surgical resection of both the cyst and the pulmonary nodule (the cyst will ultimately likely need to come out given its size).  She is going to consider the options and get back to me.  I suspect that she will want either a TTNA or referral to thoracic surgery for possible resection.  I discussed the case with Dr. Roxan Hockey and he would be willing to see her and consider resection if she elects to do so.  I will arrange for pulmonary function testing if that is the planned next step.

## 2022-03-17 NOTE — Progress Notes (Signed)
Subjective:    Patient ID: Christina [name], female    DOB: February 20, 1966, 56 y.o.   MRN: [id number]  HPI 56 year old woman with a history of former tobacco use (~5 pack years), allergic rhinitis, COVID-17 October 2021.  She also has a history of an abnormal CT scan of the chest, 2021 with a lesion in the right upper lobe that was felt to be a bronchocele.  Repeat scan done 01/23/2022 as below  She is not having any respiratory sx. She does not cough. No sputum or hemoptysis. She is active, exercises.   CT chest 01/23/2022 reviewed by me, shows an interval increase in size of a lobulated right upper lobe mass, now 3.5 x 4.5 cm. Also an enlarging thyroid nodule.   ROV 03/17/22 --follow-up visit for 56 year old woman whom I have seen for an abnormal CT scan of the chest.  She had a principally cystic lesion that had been followed on interval imaging, enlarging on serial CTs most recent 01/23/2022.  Bronchoscopy and biopsies of this lesion 02/27/2022 were nondiagnostic.  She has since had a PET scan as below  PET scan 03/09/2022 reviewed by me shows no hypermetabolism in 3.6 x 2.6 x 5.5 cm right upper lobe cystic lesion.  There is a more superior 1.5 cm focus with some mild hypermetabolism of unclear significance.   Review of Systems As per HPI  Past Medical History:  Diagnosis Date   Allergy    Anxiety    Hx of adenomatous and sessile serrated colonic polyps 03/19/2015   Mass of upper lobe of right lung      Family History  Problem Relation Age of Onset   Bladder Cancer Mother    Hypertension Mother    Hyperlipidemia Mother    Colon cancer Father 7   Cancer Father 25       colon cancer    Colon cancer Maternal Grandmother    Heart disease Maternal Grandmother    Bipolar disorder Sister    Heart disease Brother 71       Heart Attack   Alcohol abuse Brother    Bipolar disorder Sister    Depression Maternal Uncle    Cancer Paternal Uncle    Cancer Paternal Grandmother        Breast  cancer      Social History   Socioeconomic History   Marital status: Legally Separated    Spouse name: Not on file   Number of children: Not on file   Years of education: Not on file   Highest education level: Not on file  Occupational History   Not on file  Tobacco Use   Smoking status: Former    Types: Cigarettes    Quit date: 03/18/2002    Years since quitting: 20.0   Smokeless tobacco: Never  Vaping Use   Vaping Use: Never used  Substance and Sexual Activity   Alcohol use: Yes    Alcohol/week: 4.0 standard drinks    Types: 4 Glasses of wine per week    Comment: 4-5 drinks per week per pt   Drug use: No   Sexual activity: Not on file  Other Topics Concern   Not on file  Social History Narrative   Not on file   Social Determinants of Health   Financial Resource Strain: Not on file  Food Insecurity: Not on file  Transportation Needs: Not on file  Physical Activity: Not on file  Stress: Not on file  Social Connections: Not on  file  Intimate Partner Violence: Not on file     No Known Allergies   Outpatient Medications Prior to Visit  Medication Sig Dispense Refill   ALPRAZolam (XANAX) 0.25 MG tablet Take 1 tablet (0.25 mg total) by mouth See admin instructions. Take 0.25 mg if wake up in the middle of the night if needed     B Complex Vitamins (B COMPLEX 50 PO) Take 1 tablet by mouth daily.     cholecalciferol (VITAMIN D) 1000 units tablet Take 1,000 Units by mouth daily.     diphenhydrAMINE (BENADRYL) 25 MG tablet Take 25 mg by mouth at bedtime.     escitalopram (LEXAPRO) 10 MG tablet Take 5 mg by mouth at bedtime.     ibuprofen (ADVIL,MOTRIN) 200 MG tablet Take 200 mg by mouth every 6 (six) hours as needed for mild pain, moderate pain or headache.     meloxicam (MOBIC) 15 MG tablet TAKE 1 TABLET BY MOUTH EVERY DAY AS NEEDED FOR PAIN 30 tablet 3   valACYclovir (VALTREX) 1000 MG tablet Take 1,000 mg by mouth daily as needed (Cold sore).     zolpidem (AMBIEN) 10  MG tablet Take 1 tablet (10 mg total) by mouth at bedtime as needed for sleep. 30 tablet 1   No facility-administered medications prior to visit.        Objective:   Physical Exam Vitals:   03/17/22 1012  BP: 126/74  Pulse: 61  Temp: 98.1 F (36.7 C)  TempSrc: Oral  SpO2: 100%  Weight: 137 lb 6.4 oz (62.3 kg)  Height: '5\' 3"'$  (1.6 m)   Gen: Pleasant, well-nourished, in no distress,  normal affect  ENT: No lesions,  mouth clear,  oropharynx clear, no postnasal drip  Neck: No JVD, no stridor  Lungs: No use of accessory muscles, no crackles or wheezing on normal respiration, no wheeze on forced expiration  Cardiovascular: RRR, heart sounds normal, no murmur or gallops, no peripheral edema  Musculoskeletal: No deformities, no cyanosis or clubbing  Neuro: alert, awake, non focal  Skin: Warm, no lesions or rash      Assessment & Plan:  Abnormal CT of the chest I reviewed her PET scan with thoracic conference and also with the patient.  2 discrete abnormalities, the known slowly enlarging cystic lesion in the right upper lobe and also a more superior irregularly shaped nodular lesion with moderate hypermetabolism, unclear etiology.  She is low risk for malignancy but certainly cancer remains a possibility.  Reviewed the options with her that include serial imaging, needle biopsy in IR, surgical resection of both the cyst and the pulmonary nodule (the cyst will ultimately likely need to come out given its size).  She is going to consider the options and get back to me.  I suspect that she will want either a TTNA or referral to thoracic surgery for possible resection.  I discussed the case with Dr. Roxan Hockey and he would be willing to see her and consider resection if she elects to do so.  I will arrange for pulmonary function testing if that is the planned next step.   Time spent 43 minutes  Baltazar Apo, MD, PhD 03/17/2022, 12:29 PM [facility] [phone] or if no answer before 7:00PM call [phone] For any issues after 7:00PM please call eLink [phone]

## 2022-03-20 ENCOUNTER — Encounter: Payer: Self-pay | Admitting: Emergency Medicine

## 2022-03-20 DIAGNOSIS — R9389 Abnormal findings on diagnostic imaging of other specified body structures: Secondary | ICD-10-CM

## 2022-03-20 NOTE — Telephone Encounter (Signed)
Mychart message sent by pt: Christina [name]  P [facility] (supporting Lamonte Sakai, Rose Fillers, MD) 3 hours ago (1:03 PM)   Dr. Lamonte Sakai, [place].  I would like to proceed with a biopsy on the smaller mass that showed suspicious.      Regarding large cyst, I am undecided on weather or not to have it taken out by surgery,  and although I appreciate all of your advice, I would like to get a second opinion on the surgery.  Can you please have your office send over a referral to :   [facility] [address]  [place] [address] phone - [phone] fax [phone]   Thank you, [name]     Dr. Lamonte Sakai, please advise.

## 2022-03-22 ENCOUNTER — Other Ambulatory Visit: Payer: Self-pay

## 2022-03-22 DIAGNOSIS — R9389 Abnormal findings on diagnostic imaging of other specified body structures: Secondary | ICD-10-CM

## 2022-03-22 NOTE — Progress Notes (Unsigned)
Juliet Rude, MD  [name] D Not sure if this is still waiting for approval - but I approve it for CT biopsy, can be with any IR MD. Sena Hitch

## 2022-04-03 ENCOUNTER — Other Ambulatory Visit: Payer: Self-pay | Admitting: Student

## 2022-04-04 ENCOUNTER — Other Ambulatory Visit: Payer: Self-pay | Admitting: Student

## 2022-04-04 DIAGNOSIS — R911 Solitary pulmonary nodule: Secondary | ICD-10-CM

## 2022-04-05 ENCOUNTER — Ambulatory Visit (HOSPITAL_COMMUNITY)
Admission: RE | Admit: 2022-04-05 | Discharge: 2022-04-05 | Disposition: A | Discharge status: Home / Self Care | Payer: BC Managed Care – PPO | Source: Ambulatory Visit | Attending: Emergency Medicine | Admitting: Emergency Medicine

## 2022-04-05 ENCOUNTER — Encounter (HOSPITAL_COMMUNITY): Payer: Self-pay

## 2022-04-05 DIAGNOSIS — Z87891 Personal history of nicotine dependence: Secondary | ICD-10-CM | POA: Diagnosis not present

## 2022-04-05 DIAGNOSIS — R911 Solitary pulmonary nodule: Secondary | ICD-10-CM | POA: Diagnosis not present

## 2022-04-05 DIAGNOSIS — I424 Endocardial fibroelastosis: Secondary | ICD-10-CM | POA: Diagnosis not present

## 2022-04-05 DIAGNOSIS — J984 Other disorders of lung: Secondary | ICD-10-CM | POA: Insufficient documentation

## 2022-04-05 DIAGNOSIS — F419 Anxiety disorder, unspecified: Secondary | ICD-10-CM | POA: Diagnosis not present

## 2022-04-05 DIAGNOSIS — R9389 Abnormal findings on diagnostic imaging of other specified body structures: Secondary | ICD-10-CM | POA: Diagnosis not present

## 2022-04-05 DIAGNOSIS — J8410 Pulmonary fibrosis, unspecified: Secondary | ICD-10-CM | POA: Diagnosis not present

## 2022-04-05 DIAGNOSIS — R918 Other nonspecific abnormal finding of lung field: Secondary | ICD-10-CM | POA: Diagnosis not present

## 2022-04-05 LAB — CBC
HCT: 40.3 % (ref 36.0–46.0)
Hemoglobin: 13 g/dL (ref 12.0–15.0)
MCH: 31.9 pg (ref 26.0–34.0)
MCHC: 32.3 g/dL (ref 30.0–36.0)
MCV: 99 fL (ref 80.0–100.0)
Platelets: 196 10*3/uL (ref 150–400)
RBC: 4.07 MIL/uL (ref 3.87–5.11)
RDW: 12.6 % (ref 11.5–15.5)
WBC: 4.1 10*3/uL (ref 4.0–10.5)
nRBC: 0 % (ref 0.0–0.2)

## 2022-04-05 LAB — PROTIME-INR
INR: 1 (ref 0.8–1.2)
Prothrombin Time: 12.9 seconds (ref 11.4–15.2)

## 2022-04-05 MED ORDER — FENTANYL CITRATE (PF) 100 MCG/2ML IJ SOLN
INTRAMUSCULAR | Status: AC | PRN
  Administered 2022-04-05 (×2): 25 ug via INTRAVENOUS
  Administered 2022-04-05: 50 ug via INTRAVENOUS

## 2022-04-05 MED ORDER — LIDOCAINE HCL 1 % IJ SOLN
INTRAMUSCULAR | Status: AC
  Filled 2022-04-05: qty 10

## 2022-04-05 MED ORDER — MIDAZOLAM HCL 2 MG/2ML IJ SOLN
INTRAMUSCULAR | Status: AC | PRN
  Administered 2022-04-05: .5 mg via INTRAVENOUS
  Administered 2022-04-05: 1 mg via INTRAVENOUS
  Administered 2022-04-05: .5 mg via INTRAVENOUS

## 2022-04-05 MED ORDER — MIDAZOLAM HCL 2 MG/2ML IJ SOLN
INTRAMUSCULAR | Status: AC
  Filled 2022-04-05: qty 2

## 2022-04-05 MED ORDER — SODIUM CHLORIDE 0.9 % IV SOLN
INTRAVENOUS | Status: AC | PRN
  Administered 2022-04-05: 10 mL/h via INTRAVENOUS

## 2022-04-05 MED ORDER — FENTANYL CITRATE (PF) 100 MCG/2ML IJ SOLN
INTRAMUSCULAR | Status: AC
  Filled 2022-04-05: qty 2

## 2022-04-05 MED ORDER — SODIUM CHLORIDE 0.9 % IV SOLN: INTRAVENOUS | Status: DC

## 2022-04-05 NOTE — H&P (Signed)
Chief Complaint: Patient was seen in consultation today for right upper lobe lung nodule biopsy  Referring Physician(s): [place]  Supervising Physician: Corrie Mckusick  Patient Status: [facility] - Out-pt  History of Present Illness: Christina [name] is a 56 y.o. female with a past medical history significant for anxiety, allergic rhinitis, tobacco use (5 pack years) and right bronchocele who presents today for a right upper lobe lung nodule biopsy. Ms. Kaucher underwent a CT chest w/contrast 08/17/20 for follow up of a pulmonary nodule which was incidentally found previously, this showed a RUL mass most consistent with a bronchocele. She underwent a CT chest w/o contrast 01/23/22 for follow up which showed:  1. Slight interval enlargement of a lobulated mass in the right upper lobe/right suprahilar region with adjacent irregular density and mild distortion at right apex, also increased compared to prior CT. Pulmonary consultation/follow-up is advised.  She was referred to pulmonology and underwent bronchoscopy 02/27/22 however samples from the RUL mass were unable to be obtained due to location. She then underwent PET scan 03/09/22 which showed:  1. 1.5 cm spiculated nodule within the apical segment of the right upper lobe is FDG avid and suspicious for primary bronchogenic carcinoma. No signs of tracer avid nodal metastasis or distant metastatic disease. 2. Large right upper lobe fluid density structure without associated increased uptake compatible with a large bronchocele.   She has been referred to IR for biopsy of the FDG avid nodule to further direct care.   Past Medical History:  Diagnosis Date   Allergy    Anxiety    Hx of adenomatous and sessile serrated colonic polyps 03/19/2015   Mass of upper lobe of right lung     Past Surgical History:  Procedure Laterality Date   BRONCHIAL BIOPSY  02/27/2022   Procedure: BRONCHIAL BIOPSIES;  Surgeon: Collene Gobble, MD;  Location:  [facility] ENDOSCOPY;  Service: Pulmonary;;   BRONCHIAL BRUSHINGS  02/27/2022   Procedure: BRONCHIAL BRUSHINGS;  Surgeon: Collene Gobble, MD;  [facility]: [facility] ENDOSCOPY;  Service: Pulmonary;;   BRONCHIAL NEEDLE ASPIRATION BIOPSY  02/27/2022   Procedure: BRONCHIAL NEEDLE ASPIRATION BIOPSIES;  Surgeon: Collene Gobble, MD;  Location: [place];  Service: Pulmonary;;   CESAREAN SECTION     CESAREAN SECTION N/A    [employer] 07/19/2020   COLONOSCOPY  2016   ENDOBRONCHIAL ULTRASOUND  02/27/2022   Procedure: ENDOBRONCHIAL ULTRASOUND;  Surgeon: Collene Gobble, MD;  Location: [facility] ENDOSCOPY;  Service: Pulmonary;;   KNEE ARTHROSCOPY     right knee   VIDEO BRONCHOSCOPY WITH RADIAL ENDOBRONCHIAL ULTRASOUND  02/27/2022   Procedure: VIDEO BRONCHOSCOPY WITH RADIAL ENDOBRONCHIAL ULTRASOUND;  Surgeon: Collene Gobble, MD;  Location: [place] ENDOSCOPY;  Service: Pulmonary;;    Allergies: Patient has no known allergies.  Medications: Prior to Admission medications   Medication Sig Start Date End Date Taking? Authorizing Provider  ALPRAZolam (XANAX) 0.25 MG tablet Take 1 tablet (0.25 mg total) by mouth See admin instructions. Take 0.25 mg if wake up in the middle of the night if needed 02/27/22  Yes Byrum, Rose Fillers, MD  B Complex Vitamins (B COMPLEX 50 PO) Take 1 tablet by mouth daily.   Yes [provider]  cholecalciferol (VITAMIN D) 1000 units tablet Take 1,000 Units by mouth daily.   Yes [provider]  diphenhydrAMINE (BENADRYL) 25 MG tablet Take 25 mg by mouth at bedtime.   Yes [provider]  escitalopram (LEXAPRO) 10 MG tablet Take 5 mg by mouth  at bedtime.   Yes [provider]  ibuprofen (ADVIL,MOTRIN) 200 MG tablet Take 200 mg by mouth every 6 (six) hours as needed for mild pain, moderate pain or headache.   Yes [provider]  meloxicam (MOBIC) 15 MG tablet TAKE 1 TABLET BY MOUTH EVERY DAY AS NEEDED FOR PAIN 03/21/21  Yes Susy Frizzle, MD  valACYclovir (VALTREX) 1000  MG tablet Take 1,000 mg by mouth daily as needed (Cold sore). 10/28/21   [provider]  zolpidem (AMBIEN) 10 MG tablet Take 1 tablet (10 mg total) by mouth at bedtime as needed for sleep. 03/02/22 04/01/22  Susy Frizzle, MD     Family History  Problem Relation Age of Onset   Bladder Cancer Mother    Hypertension Mother    Hyperlipidemia Mother    Colon cancer Father 19   Cancer Father 55       colon cancer    Colon cancer Maternal Grandmother    Heart disease Maternal Grandmother    Bipolar disorder Sister    Heart disease Brother 18       Heart Attack   Alcohol abuse Brother    Bipolar disorder Sister    Depression Maternal Uncle    Cancer Paternal Uncle    Cancer Paternal Grandmother        Breast cancer     Social History   Socioeconomic History   Marital status: Legally Separated    Spouse name: Not on file   Number of children: Not on file   Years of education: Not on file   Highest education level: Not on file  Occupational History   Not on file  Tobacco Use   Smoking status: Former    Types: Cigarettes    Quit date: 03/18/2002    Years since quitting: 20.0   Smokeless tobacco: Never  Vaping Use   Vaping Use: Never used  Substance and Sexual Activity   Alcohol use: Yes    Alcohol/week: 4.0 standard drinks    Types: 4 Glasses of wine per week    Comment: 4-5 drinks per week per pt   Drug use: No   Sexual activity: Not on file  Other Topics Concern   Not on file  Social History Narrative   Not on file   Social Determinants of Health   Financial Resource Strain: Not on file  Food Insecurity: Not on file  Transportation Needs: Not on file  Physical Activity: Not on file  Stress: Not on file  Social Connections: Not on file     Review of Systems: A 12 point ROS discussed and pertinent positives are indicated in the HPI above.  All other systems are negative.  Review of Systems  Constitutional:  Negative for chills and fever.   Respiratory:  Negative for cough and shortness of breath.   Cardiovascular:  Negative for chest pain.  Gastrointestinal:  Negative for abdominal pain, diarrhea, nausea and vomiting.  Musculoskeletal:  Negative for back pain.  Neurological:  Negative for dizziness and headaches.   Vital Signs: BP 121/74   Pulse (!) 56   Temp 97.9 F (36.6 C) (Oral)   Resp 16   Ht '5\' 3"'$  (1.6 m)   Wt 135 lb (61.2 kg)   LMP 03/04/2015   SpO2 99%   BMI 23.91 kg/m   Physical Exam Vitals reviewed.  Constitutional:      General: She is not in acute distress. HENT:     Head: Normocephalic.  Mouth/Throat:     Mouth: Mucous membranes are moist.     Pharynx: Oropharynx is clear. No oropharyngeal exudate or posterior oropharyngeal erythema.  Cardiovascular:     Rate and Rhythm: Normal rate and regular rhythm.  Pulmonary:     Effort: Pulmonary effort is normal.     Breath sounds: Normal breath sounds.  Abdominal:     General: There is no distension.     Palpations: Abdomen is soft.     Tenderness: There is no abdominal tenderness.  Skin:    General: Skin is warm.  Neurological:     Mental Status: She is alert and oriented to person, place, and time.  Psychiatric:        Mood and Affect: Mood normal.        Behavior: Behavior normal.        Thought Content: Thought content normal.        Judgment: Judgment normal.     MD Evaluation Airway: WNL Heart: WNL Abdomen: WNL Chest/ Lungs: WNL ASA  Classification: 2 Mallampati/Airway Score: One   Imaging: NM PET Image Initial (PI) Skull Base To Thigh  Result Date: 03/09/2022 CLINICAL DATA:  Initial  treatment strategy for lung nodule. EXAM: NUCLEAR MEDICINE PET SKULL BASE TO THIGH TECHNIQUE: 7.39 mCi F-18 FDG was injected intravenously. Full-ring PET imaging was performed from the skull base to thigh after the radiotracer. CT data was obtained and used for attenuation correction and anatomic localization. Fasting blood glucose: 90 mg/dl  COMPARISON:  01/23/2022 FINDINGS: Mediastinal blood pool activity: SUV max 2.11 Liver activity: SUV max NA NECK: No hypermetabolic lymph nodes in the neck. Incidental CT findings: none CHEST: No tracer avid lymph nodes within the axillary, supraclavicular, mediastinal, or hilar regions. Within the posterior right apex there is a spiculated nodule measuring 1.5 x 0.9 cm with an SUV max of 3.64, image 19/7. Suspicious for primary bronchogenic carcinoma. Again noted is a large fluid density structure which extends from the right hilum into the apical portion of the right upper lobe measuring 3.6 x 2.6 by 5.5 cm, image 29/7. There is surrounding architectural distortion and scarring noted. There is no significant tracer uptake within this structure which is been previously characterized as a large bronchocele. No additional tracer avid pulmonary nodule or mass identified. Incidental CT findings: None ABDOMEN/PELVIS: No abnormal tracer uptake identified within the liver, pancreas, spleen, or adrenal glands. No tracer avid abdominopelvic lymph nodes. Incidental CT findings: Sigmoid diverticulosis noted without signs of acute diverticulitis. SKELETON: No focal hypermetabolic activity to suggest skeletal metastasis. Incidental CT findings: none IMPRESSION: 1. 1.5 cm spiculated nodule within the apical segment of the right upper lobe is FDG avid and suspicious for primary bronchogenic carcinoma. No signs of tracer avid nodal metastasis or distant metastatic disease. 2. Large right upper lobe fluid density structure without associated increased uptake compatible with a large bronchocele. Electronically Signed   By: Kerby Moors M.D.   On: 03/09/2022 10:13    Labs:  CBC: Recent Labs    09/08/21 0849 02/27/22 0555 04/05/22 0630  WBC 4.7 3.8* 4.1  HGB 14.0 12.3 13.0  HCT 41.9 37.4 40.3  PLT 238 172 196    COAGS: Recent Labs    04/05/22 0630  INR 1.0    BMP: Recent Labs    09/08/21 0849  NA 139  K  4.2  CL 102  CO2 29  GLUCOSE 84  BUN 10  CALCIUM 9.6  CREATININE 0.73    LIVER FUNCTION  TESTS: Recent Labs    09/08/21 0849  BILITOT 0.4  AST 22  ALT 19  PROT 6.8    TUMOR MARKERS: No results for input(s): AFPTM, CEA, CA199, CHROMGRNA in the last 8760 hours.  Assessment and Plan:  56 y/o F with history of enlarging RUL mass most consistent with bronchocele however recently noted to have an FDG avid 1.5 cm spiculated nodule within the apical segment of the RUL suspicious for primary bronchogenic carcinoma who presents today for lung nodule biopsy to further direct care.  Risks and benefits discussed with the patient including, but not limited to bleeding, hemoptysis, respiratory failure requiring intubation, infection, pneumothorax requiring chest tube placement, stroke from air embolism or even death.  All of the patient's questions were answered, patient is agreeable to proceed.  Consent signed and in chart.  Thank you for this interesting consult.  I greatly enjoyed meeting Christina [name] and look forward to participating in their care.  A copy of this report was sent to the requesting provider on this date.  Electronically Signed: Joaquim Nam, PA-C 04/05/2022, 7:29 AM   I spent a total of  30 Minutes   in face to face in clinical consultation, greater than 50% of which was counseling/coordinating care for lung nodule biopsy.

## 2022-04-05 NOTE — Progress Notes (Signed)
PCXR results called to Shannon/Radiology PA-orders pt may eat and continue d/c

## 2022-04-05 NOTE — Procedures (Signed)
Interventional Radiology Procedure Note  Procedure: CT guided biopsy of right apical lung nodule Complications: None Recommendations: - Bedrest until CXR cleared.  Minimize talking, coughing or otherwise straining.  - Follow up 1 hr CXR pending  - NPO until CXR cleared  Signed,  Corrie Mckusick, DO

## 2022-04-07 LAB — SURGICAL PATHOLOGY

## 2022-04-10 NOTE — Telephone Encounter (Signed)
Reviewed transthoracic needle biopsy results with the patient.  Shows noncaseating granulomas.  AFB and fungal stains were negative.  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  No evidence of malignancy.  No formal culture data or BAL available from her original bronchoscopy when her cystic biopsies were performed. I recommended that she have a QuantiFERON gold for completeness even though she has never had a known TB exposure, does not have any clinical symptoms consistent with TB.  We will work on ordering this.  Now that we have performed the biopsy I think will be a good time to make the requested referral to [facility] for second opinion regarding these findings and also possible elective resection of the cyst.  Please order QuantiFERON gold and have her come into the office and get it drawn. Please refer the patient to [facility] for second opinion regarding right upper lobe granulomatous disease and right upper lobe bronchogenic cyst.  [facility] [address]  [place] [address] phone - [phone] fax [phone]

## 2022-04-12 NOTE — Telephone Encounter (Signed)
Orders were placed per Dr. Lamonte Sakai and pt notified via My Chart. Nothing further needed at this time.

## 2022-04-18 ENCOUNTER — Other Ambulatory Visit: Payer: BC Managed Care – PPO

## 2022-04-18 ENCOUNTER — Other Ambulatory Visit: Payer: Self-pay

## 2022-04-18 DIAGNOSIS — R9389 Abnormal findings on diagnostic imaging of other specified body structures: Secondary | ICD-10-CM

## 2022-04-18 NOTE — Addendum Note (Signed)
Addended by: Gavin Potters R on: 04/18/2022 10:02 AM   Modules accepted: Orders

## 2022-04-22 LAB — QUANTIFERON-TB GOLD PLUS
Mitogen-NIL: 9.3 IU/mL
NIL: 0.04 IU/mL
QuantiFERON-TB Gold Plus: NEGATIVE
TB1-NIL: 0.01 IU/mL
TB2-NIL: 0 IU/mL

## 2022-04-24 DIAGNOSIS — R918 Other nonspecific abnormal finding of lung field: Secondary | ICD-10-CM | POA: Diagnosis not present

## 2022-04-24 DIAGNOSIS — E041 Nontoxic single thyroid nodule: Secondary | ICD-10-CM | POA: Diagnosis not present

## 2022-04-27 ENCOUNTER — Ambulatory Visit: Payer: BC Managed Care – PPO | Admitting: Emergency Medicine

## 2022-05-03 ENCOUNTER — Other Ambulatory Visit: Payer: Self-pay | Admitting: Family Medicine

## 2022-05-09 DIAGNOSIS — R918 Other nonspecific abnormal finding of lung field: Secondary | ICD-10-CM | POA: Diagnosis not present

## 2022-05-09 DIAGNOSIS — Z6823 Body mass index (BMI) 23.0-23.9, adult: Secondary | ICD-10-CM | POA: Diagnosis not present

## 2022-05-09 DIAGNOSIS — Z87891 Personal history of nicotine dependence: Secondary | ICD-10-CM | POA: Diagnosis not present

## 2022-05-09 DIAGNOSIS — R0602 Shortness of breath: Secondary | ICD-10-CM | POA: Diagnosis not present

## 2022-05-09 DIAGNOSIS — E041 Nontoxic single thyroid nodule: Secondary | ICD-10-CM | POA: Diagnosis not present

## 2022-05-10 ENCOUNTER — Other Ambulatory Visit: Payer: Self-pay

## 2022-05-10 NOTE — Telephone Encounter (Signed)
LOV 11/10/21 Last refill 02/27/22, #0, 0 refills    Per pt stated you have always prescribed the meds and not dr. Lamonte Sakai.   Please review, thanks!

## 2022-05-11 ENCOUNTER — Ambulatory Visit (INDEPENDENT_AMBULATORY_CARE_PROVIDER_SITE_OTHER): Payer: BC Managed Care – PPO | Admitting: Emergency Medicine

## 2022-05-11 ENCOUNTER — Other Ambulatory Visit: Payer: Self-pay | Admitting: Family Medicine

## 2022-05-11 DIAGNOSIS — R9389 Abnormal findings on diagnostic imaging of other specified body structures: Secondary | ICD-10-CM

## 2022-05-11 LAB — PULMONARY FUNCTION TEST
DL/VA % pred: 107 %
DL/VA: 4.61 ml/min/mmHg/L
DLCO cor % pred: 118 %
DLCO cor: 23.66 ml/min/mmHg
DLCO unc % pred: 116 %
DLCO unc: 23.36 ml/min/mmHg
FEF 25-75 Post: 3.6 L/s
FEF 25-75 Pre: 3.54 L/s
FEF2575-%Change-Post: 1 %
FEF2575-%Pred-Post: 141 %
FEF2575-%Pred-Pre: 139 %
FEV1-%Change-Post: 0 %
FEV1-%Pred-Post: 117 %
FEV1-%Pred-Pre: 117 %
FEV1-Post: 3.07 L
FEV1-Pre: 3.07 L
FEV1FVC-%Change-Post: 1 %
FEV1FVC-%Pred-Pre: 106 %
FEV6-%Change-Post: -2 %
FEV6-%Pred-Post: 109 %
FEV6-%Pred-Pre: 112 %
FEV6-Post: 3.55 L
FEV6-Pre: 3.63 L
FEV6FVC-%Pred-Post: 103 %
FEV6FVC-%Pred-Pre: 103 %
FVC-%Change-Post: -1 %
FVC-%Pred-Post: 107 %
FVC-%Pred-Pre: 108 %
FVC-Post: 3.59 L
FVC-Pre: 3.63 L
Post FEV1/FVC ratio: 86 %
Post FEV6/FVC ratio: 100 %
Pre FEV1/FVC ratio: 84 %
Pre FEV6/FVC Ratio: 100 %
RV % pred: 90 %
RV: 1.64 L
TLC % pred: 111 %
TLC: 5.46 L

## 2022-05-11 MED ORDER — ALPRAZOLAM 0.25 MG PO TABS
0.2500 mg | ORAL_TABLET | ORAL | Status: DC
Start: 2022-05-11 — End: 2022-05-11

## 2022-05-11 MED ORDER — ALPRAZOLAM 0.25 MG PO TABS
0.2500 mg | ORAL_TABLET | ORAL | 3 refills | Status: DC
Start: 2022-05-11 — End: 2023-02-22

## 2022-05-11 NOTE — Progress Notes (Signed)
Full PFT Performed Today  

## 2022-05-11 NOTE — Patient Instructions (Signed)
Full PFT Performed Today  

## 2022-05-16 ENCOUNTER — Encounter: Payer: Self-pay | Admitting: Emergency Medicine

## 2022-05-30 DIAGNOSIS — Z87891 Personal history of nicotine dependence: Secondary | ICD-10-CM | POA: Diagnosis not present

## 2022-05-30 DIAGNOSIS — R918 Other nonspecific abnormal finding of lung field: Secondary | ICD-10-CM | POA: Diagnosis not present

## 2022-05-30 DIAGNOSIS — Z6824 Body mass index (BMI) 24.0-24.9, adult: Secondary | ICD-10-CM | POA: Diagnosis not present

## 2022-05-30 DIAGNOSIS — Q324 Other congenital malformations of bronchus: Secondary | ICD-10-CM | POA: Diagnosis not present

## 2022-05-30 DIAGNOSIS — J9809 Other diseases of bronchus, not elsewhere classified: Secondary | ICD-10-CM | POA: Diagnosis not present

## 2022-06-06 ENCOUNTER — Ambulatory Visit: Payer: BC Managed Care – PPO | Admitting: Emergency Medicine

## 2022-06-07 DIAGNOSIS — R918 Other nonspecific abnormal finding of lung field: Secondary | ICD-10-CM | POA: Diagnosis not present

## 2022-06-08 DIAGNOSIS — L814 Other melanin hyperpigmentation: Secondary | ICD-10-CM | POA: Diagnosis not present

## 2022-06-08 DIAGNOSIS — D489 Neoplasm of uncertain behavior, unspecified: Secondary | ICD-10-CM | POA: Diagnosis not present

## 2022-06-08 DIAGNOSIS — L578 Other skin changes due to chronic exposure to nonionizing radiation: Secondary | ICD-10-CM | POA: Diagnosis not present

## 2022-06-08 DIAGNOSIS — D229 Melanocytic nevi, unspecified: Secondary | ICD-10-CM | POA: Diagnosis not present

## 2022-06-08 DIAGNOSIS — L821 Other seborrheic keratosis: Secondary | ICD-10-CM | POA: Diagnosis not present

## 2022-06-08 DIAGNOSIS — D1801 Hemangioma of skin and subcutaneous tissue: Secondary | ICD-10-CM | POA: Diagnosis not present

## 2022-06-08 DIAGNOSIS — C44311 Basal cell carcinoma of skin of nose: Secondary | ICD-10-CM | POA: Diagnosis not present

## 2022-06-16 ENCOUNTER — Ambulatory Visit (HOSPITAL_COMMUNITY)
Admission: EM | Admit: 2022-06-16 | Discharge: 2022-06-16 | Disposition: A | Discharge status: Home / Self Care | Payer: BC Managed Care – PPO | Attending: Family Medicine | Admitting: Family Medicine

## 2022-06-16 ENCOUNTER — Encounter (HOSPITAL_COMMUNITY): Payer: Self-pay

## 2022-06-16 DIAGNOSIS — R52 Pain, unspecified: Secondary | ICD-10-CM

## 2022-06-16 DIAGNOSIS — R051 Acute cough: Secondary | ICD-10-CM | POA: Insufficient documentation

## 2022-06-16 DIAGNOSIS — H9201 Otalgia, right ear: Secondary | ICD-10-CM | POA: Insufficient documentation

## 2022-06-16 DIAGNOSIS — Z20822 Contact with and (suspected) exposure to covid-19: Secondary | ICD-10-CM | POA: Diagnosis not present

## 2022-06-16 LAB — SARS CORONAVIRUS 2 BY RT PCR: SARS Coronavirus 2 by RT PCR: NEGATIVE

## 2022-06-16 NOTE — ED Provider Notes (Signed)
[facility]    CSN: [id number] Arrival date & time: 06/16/22  1354      History   Chief Complaint Chief Complaint  Patient presents with   Headache   Cough   Otalgia    HPI Christina [name] is a 56 y.o. female.   Patient is here for URI symptoms.  Started yesterday with right ear pain, mid back pain, mild headache, and dry cough.   Cough worsened today.  Mild congestion, but not bad.  No runny nose.  No fevers/chills.  She went to bed early last night as she felt poorly.  She did take ibuprofen pm, advil.   Past Medical History:  Diagnosis Date   Allergy    Anxiety    Hx of adenomatous and sessile serrated colonic polyps 03/19/2015   Mass of upper lobe of right lung     Patient Active Problem List   Diagnosis Date Noted   Abnormal CT of the chest 01/31/2022   Thyroid nodule 01/31/2022   Herpes labialis 12/27/2020   Reduced libido 12/27/2020   Family history of malignant neoplasm of gastrointestinal tract 11/25/2020   Family history of neoplasm of breast 11/07/2017   Tinnitus 01/11/2016   Dysfunction of both eustachian tubes 01/11/2016   Sensorineural hearing loss (SNHL), bilateral 01/11/2016   Family history of colon cancer 03/31/2015   Hx of adenomatous and sessile serrated colonic polyps 03/19/2015   Depression with anxiety 06/17/2010   GERD 06/17/2010   FLANK PAIN, LEFT 06/16/2010    Past Surgical History:  Procedure Laterality Date   BRONCHIAL BIOPSY  02/27/2022   Procedure: BRONCHIAL BIOPSIES;  Surgeon: Collene Gobble, MD;  Location: [facility] ENDOSCOPY;  Service: Pulmonary;;   BRONCHIAL BRUSHINGS  02/27/2022   Procedure: BRONCHIAL BRUSHINGS;  Surgeon: Collene Gobble, MD;  Location: [facility] ENDOSCOPY;  Service: Pulmonary;;   BRONCHIAL NEEDLE ASPIRATION BIOPSY  02/27/2022   Procedure: BRONCHIAL NEEDLE ASPIRATION BIOPSIES;  Surgeon: Collene Gobble, MD;  Location: [place];  Service: Pulmonary;;   CESAREAN SECTION     CESAREAN SECTION N/A    [employer]reesia  07/19/2020   COLONOSCOPY  2016   ENDOBRONCHIAL ULTRASOUND  02/27/2022   Procedure: ENDOBRONCHIAL ULTRASOUND;  Surgeon: Collene Gobble, MD;  Location: [facility] ENDOSCOPY;  Service: Pulmonary;;   KNEE ARTHROSCOPY     right knee   VIDEO BRONCHOSCOPY WITH RADIAL ENDOBRONCHIAL ULTRASOUND  02/27/2022   Procedure: VIDEO BRONCHOSCOPY WITH RADIAL ENDOBRONCHIAL ULTRASOUND;  Surgeon: Collene Gobble, MD;  Location: [facility] ENDOSCOPY;  Service: Pulmonary;;    OB History   No obstetric history on file.      Home Medications    Prior to Admission medications   Medication Sig Start Date End Date Taking? Authorizing Provider  ALPRAZolam (XANAX) 0.25 MG tablet Take 1 tablet (0.25 mg total) by mouth See admin instructions. Take 0.25 mg if wake up in the middle of the night if needed 05/11/22  Yes Pickard, Cammie Mcgee, MD  B Complex Vitamins (B COMPLEX 50 PO) Take 1 tablet by mouth daily.   Yes [provider]  cholecalciferol (VITAMIN D) 1000 units tablet Take 1,000 Units by mouth daily.   Yes [provider]  escitalopram (LEXAPRO) 10 MG tablet Take 5 mg by mouth at bedtime.   Yes [provider]  meloxicam (MOBIC) 15 MG tablet TAKE 1 TABLET BY MOUTH EVERY DAY AS NEEDED FOR PAIN 03/21/21  Yes Susy Frizzle, MD  zolpidem (AMBIEN) 10 MG tablet Take 1 tablet (10  mg total) by mouth at bedtime as needed for sleep. 03/02/22 06/16/22 Yes Susy Frizzle, MD  diphenhydrAMINE (BENADRYL) 25 MG tablet Take 25 mg by mouth at bedtime.    [provider]  ibuprofen (ADVIL,MOTRIN) 200 MG tablet Take 200 mg by mouth every 6 (six) hours as needed for mild pain, moderate pain or headache.    [provider]  valACYclovir (VALTREX) 1000 MG tablet Take 1,000 mg by mouth daily as needed (Cold sore). 10/28/21   [provider]    Family History Family History  Problem Relation Age of Onset   Bladder Cancer Mother    Hypertension Mother    Hyperlipidemia Mother    Colon cancer  Father 66   Cancer Father 11       colon cancer    Colon cancer Maternal Grandmother    Heart disease Maternal Grandmother    Bipolar disorder Sister    Heart disease Brother 82       Heart Attack   Alcohol abuse Brother    Bipolar disorder Sister    Depression Maternal Uncle    Cancer Paternal Uncle    Cancer Paternal Grandmother        Breast cancer     Social History Social History   Tobacco Use   Smoking status: Former    Types: Cigarettes    Quit date: 03/18/2002    Years since quitting: 20.2   Smokeless tobacco: Never  Vaping Use   Vaping Use: Never used  Substance Use Topics   Alcohol use: Yes    Alcohol/week: 4.0 standard drinks of alcohol    Types: 4 Glasses of wine per week    Comment: 4-5 drinks per week per pt   Drug use: No     Allergies   Patient has no known allergies.   Review of Systems Review of Systems  Constitutional:  Negative for chills and fever.  HENT:  Positive for ear pain. Negative for congestion and rhinorrhea.   Respiratory:  Positive for cough.   Gastrointestinal: Negative.   Genitourinary: Negative.   Neurological:  Positive for headaches.     Physical Exam Triage Vital Signs ED Triage Vitals  Enc Vitals Group     BP 06/16/22 1415 105/64     Pulse Rate 06/16/22 1415 70     Resp 06/16/22 1415 16     Temp 06/16/22 1415 99.2 F (37.3 C)     Temp Source 06/16/22 1415 Oral     SpO2 06/16/22 1415 99 %     Weight 06/16/22 1417 135 lb (61.2 kg)     Height 06/16/22 1417 '5\' 3"'$  (1.6 m)     Head Circumference --      Peak Flow --      Pain Score 06/16/22 1417 2     Pain Loc --      Pain Edu? --      Excl. in [place]? --    No data found.  Updated Vital Signs BP 105/64 (BP Location: Right Arm)   Pulse 70   Temp 99.2 F (37.3 C) (Oral)   Resp 16   Ht '5\' 3"'$  (1.6 m)   Wt 61.2 kg   LMP 03/04/2015   SpO2 99%   BMI 23.91 kg/m   Visual Acuity Right Eye Distance:   Left Eye Distance:   Bilateral Distance:    Right Eye  Near:   Left Eye Near:    Bilateral Near:     Physical  Exam Constitutional:      Appearance: Normal appearance.  HENT:     Head: Normocephalic.     Right Ear: Tympanic membrane and ear canal normal.     Left Ear: Tympanic membrane and ear canal normal.     Nose: Nose normal.     Mouth/Throat:     Mouth: Mucous membranes are moist.  Cardiovascular:     Rate and Rhythm: Normal rate and regular rhythm.  Pulmonary:     Effort: Pulmonary effort is normal.     Breath sounds: Normal breath sounds.  Musculoskeletal:     Cervical back: Normal range of motion and neck supple.  Skin:    General: Skin is warm.  Neurological:     General: No focal deficit present.     Mental Status: She is alert.  Psychiatric:        Mood and Affect: Mood normal.      UC Treatments / Results  Labs (all labs ordered are listed, but only abnormal results are displayed) Labs Reviewed - No data to display  EKG   Radiology No results found.  Procedures Procedures (including critical care time)  Medications Ordered in UC Medications - No data to display  Initial Impression / Assessment and Plan / UC Course  I have reviewed the triage vital signs and the nursing notes.  Pertinent labs & imaging results that were available during my care of the patient were reviewed by me and considered in my medical decision making (see chart for details).  Patient was seen today for cough, body aches, and headache.  No bacterial infection noted on exam.  Likely viral, including covid-19 infection  She was swabbed for this today.  She does have a daughter at home with cerebral palsy so would like to know results asap.  2 hr test ordered today.  If positive, she will likely want treatment.  Would consider molnupiravir to start.   Final Clinical Impressions(s) / UC Diagnoses   Final diagnoses:  Acute cough  Body aches  Right ear pain     Discharge Instructions      You were seen today for upper  respiratory symptoms.  Your covid test will be resulted by the end of the day (hopefully).  If positive, we will call you and discuss treatment options.  For now, supportive care only for your symptoms, including motrin, tylenol, or mucinex.     ED Prescriptions   None    PDMP not reviewed this encounter.   Rondel Oh, MD 06/16/22 1442
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
## 2022-06-16 NOTE — Discharge Instructions (Signed)
You were seen today for upper respiratory symptoms.  Your covid test will be resulted by the end of the day (hopefully).  If positive, we will call you and discuss treatment options.  For now, supportive care only for your symptoms, including motrin, tylenol, or mucinex.

## 2022-06-18 DIAGNOSIS — E872 Acidosis, unspecified: Secondary | ICD-10-CM | POA: Diagnosis not present

## 2022-06-18 DIAGNOSIS — D62 Acute posthemorrhagic anemia: Secondary | ICD-10-CM | POA: Diagnosis not present

## 2022-06-18 DIAGNOSIS — J850 Gangrene and necrosis of lung: Secondary | ICD-10-CM | POA: Diagnosis not present

## 2022-06-18 DIAGNOSIS — Q324 Other congenital malformations of bronchus: Secondary | ICD-10-CM | POA: Diagnosis not present

## 2022-06-18 DIAGNOSIS — R918 Other nonspecific abnormal finding of lung field: Secondary | ICD-10-CM | POA: Diagnosis not present

## 2022-06-18 DIAGNOSIS — I959 Hypotension, unspecified: Secondary | ICD-10-CM | POA: Diagnosis not present

## 2022-06-18 DIAGNOSIS — H539 Unspecified visual disturbance: Secondary | ICD-10-CM | POA: Diagnosis not present

## 2022-06-18 DIAGNOSIS — J984 Other disorders of lung: Secondary | ICD-10-CM | POA: Diagnosis not present

## 2022-06-18 DIAGNOSIS — R590 Localized enlarged lymph nodes: Secondary | ICD-10-CM | POA: Diagnosis not present

## 2022-06-18 DIAGNOSIS — J90 Pleural effusion, not elsewhere classified: Secondary | ICD-10-CM | POA: Diagnosis not present

## 2022-06-18 DIAGNOSIS — R093 Abnormal sputum: Secondary | ICD-10-CM | POA: Diagnosis not present

## 2022-06-18 DIAGNOSIS — C44311 Basal cell carcinoma of skin of nose: Secondary | ICD-10-CM | POA: Diagnosis not present

## 2022-06-18 DIAGNOSIS — B449 Aspergillosis, unspecified: Secondary | ICD-10-CM | POA: Diagnosis not present

## 2022-06-18 DIAGNOSIS — G8912 Acute post-thoracotomy pain: Secondary | ICD-10-CM | POA: Diagnosis not present

## 2022-06-18 DIAGNOSIS — J939 Pneumothorax, unspecified: Secondary | ICD-10-CM | POA: Diagnosis not present

## 2022-06-18 DIAGNOSIS — Z4682 Encounter for fitting and adjustment of non-vascular catheter: Secondary | ICD-10-CM | POA: Diagnosis not present

## 2022-06-18 DIAGNOSIS — J479 Bronchiectasis, uncomplicated: Secondary | ICD-10-CM | POA: Diagnosis not present

## 2022-06-18 DIAGNOSIS — J851 Abscess of lung with pneumonia: Secondary | ICD-10-CM | POA: Diagnosis not present

## 2022-06-18 DIAGNOSIS — B441 Other pulmonary aspergillosis: Secondary | ICD-10-CM | POA: Diagnosis not present

## 2022-06-18 DIAGNOSIS — B49 Unspecified mycosis: Secondary | ICD-10-CM | POA: Diagnosis not present

## 2022-06-18 DIAGNOSIS — J168 Pneumonia due to other specified infectious organisms: Secondary | ICD-10-CM | POA: Diagnosis not present

## 2022-06-18 DIAGNOSIS — R519 Headache, unspecified: Secondary | ICD-10-CM | POA: Diagnosis not present

## 2022-06-18 DIAGNOSIS — R400 Somnolence: Secondary | ICD-10-CM | POA: Diagnosis not present

## 2022-06-18 DIAGNOSIS — B4489 Other forms of aspergillosis: Secondary | ICD-10-CM | POA: Diagnosis not present

## 2022-06-18 DIAGNOSIS — J189 Pneumonia, unspecified organism: Secondary | ICD-10-CM | POA: Diagnosis not present

## 2022-06-18 DIAGNOSIS — E559 Vitamin D deficiency, unspecified: Secondary | ICD-10-CM | POA: Diagnosis not present

## 2022-06-18 DIAGNOSIS — E861 Hypovolemia: Secondary | ICD-10-CM | POA: Diagnosis not present

## 2022-06-18 DIAGNOSIS — J948 Other specified pleural conditions: Secondary | ICD-10-CM | POA: Diagnosis not present

## 2022-06-18 DIAGNOSIS — R042 Hemoptysis: Secondary | ICD-10-CM | POA: Diagnosis not present

## 2022-06-18 DIAGNOSIS — Z9889 Other specified postprocedural states: Secondary | ICD-10-CM | POA: Diagnosis not present

## 2022-06-18 DIAGNOSIS — J329 Chronic sinusitis, unspecified: Secondary | ICD-10-CM | POA: Diagnosis not present

## 2022-06-18 DIAGNOSIS — G629 Polyneuropathy, unspecified: Secondary | ICD-10-CM | POA: Diagnosis not present

## 2022-06-18 DIAGNOSIS — Z20822 Contact with and (suspected) exposure to covid-19: Secondary | ICD-10-CM | POA: Diagnosis not present

## 2022-06-18 DIAGNOSIS — Z902 Acquired absence of lung [part of]: Secondary | ICD-10-CM | POA: Diagnosis not present

## 2022-06-18 DIAGNOSIS — Z6827 Body mass index (BMI) 27.0-27.9, adult: Secondary | ICD-10-CM | POA: Diagnosis not present

## 2022-06-18 DIAGNOSIS — Z792 Long term (current) use of antibiotics: Secondary | ICD-10-CM | POA: Diagnosis not present

## 2022-06-18 DIAGNOSIS — R911 Solitary pulmonary nodule: Secondary | ICD-10-CM | POA: Diagnosis not present

## 2022-06-18 DIAGNOSIS — F419 Anxiety disorder, unspecified: Secondary | ICD-10-CM | POA: Diagnosis not present

## 2022-06-18 DIAGNOSIS — R059 Cough, unspecified: Secondary | ICD-10-CM | POA: Diagnosis not present

## 2022-06-18 DIAGNOSIS — R0789 Other chest pain: Secondary | ICD-10-CM | POA: Diagnosis not present

## 2022-06-19 DIAGNOSIS — R918 Other nonspecific abnormal finding of lung field: Secondary | ICD-10-CM | POA: Diagnosis not present

## 2022-06-20 DIAGNOSIS — R918 Other nonspecific abnormal finding of lung field: Secondary | ICD-10-CM | POA: Diagnosis not present

## 2022-06-21 DIAGNOSIS — R918 Other nonspecific abnormal finding of lung field: Secondary | ICD-10-CM | POA: Diagnosis not present

## 2022-07-05 DIAGNOSIS — Z8619 Personal history of other infectious and parasitic diseases: Secondary | ICD-10-CM | POA: Diagnosis not present

## 2022-07-05 DIAGNOSIS — B449 Aspergillosis, unspecified: Secondary | ICD-10-CM | POA: Diagnosis not present

## 2022-07-07 ENCOUNTER — Telehealth: Payer: Self-pay

## 2022-07-07 DIAGNOSIS — R590 Localized enlarged lymph nodes: Secondary | ICD-10-CM | POA: Diagnosis not present

## 2022-07-07 DIAGNOSIS — E559 Vitamin D deficiency, unspecified: Secondary | ICD-10-CM | POA: Diagnosis not present

## 2022-07-07 DIAGNOSIS — Z87891 Personal history of nicotine dependence: Secondary | ICD-10-CM | POA: Diagnosis not present

## 2022-07-07 DIAGNOSIS — G4730 Sleep apnea, unspecified: Secondary | ICD-10-CM | POA: Diagnosis not present

## 2022-07-07 DIAGNOSIS — F32A Depression, unspecified: Secondary | ICD-10-CM | POA: Diagnosis not present

## 2022-07-07 DIAGNOSIS — R918 Other nonspecific abnormal finding of lung field: Secondary | ICD-10-CM | POA: Diagnosis not present

## 2022-07-07 DIAGNOSIS — Z8701 Personal history of pneumonia (recurrent): Secondary | ICD-10-CM | POA: Diagnosis not present

## 2022-07-07 DIAGNOSIS — B441 Other pulmonary aspergillosis: Secondary | ICD-10-CM | POA: Diagnosis not present

## 2022-07-07 DIAGNOSIS — J309 Allergic rhinitis, unspecified: Secondary | ICD-10-CM | POA: Diagnosis not present

## 2022-07-07 DIAGNOSIS — F419 Anxiety disorder, unspecified: Secondary | ICD-10-CM | POA: Diagnosis not present

## 2022-07-07 DIAGNOSIS — Z902 Acquired absence of lung [part of]: Secondary | ICD-10-CM | POA: Diagnosis not present

## 2022-07-07 NOTE — Telephone Encounter (Signed)
Transition Care Management Follow-up Telephone Call Date of discharge and from where: 07/03/22 From [facility] How have you been since you were released from the hospital? Feeling better given the circumstances per pt Any questions or concerns? No  Items Reviewed: Did the pt receive and understand the discharge instructions provided? Yes  Medications obtained and verified? Yes  Other? No  Any new allergies since your discharge? No  Dietary orders reviewed? No Do you have support at home? Yes   Home Care and Equipment/Supplies: Were home health services ordered? yes If so, what is the name of the agency? Pt  assessment this morning  Has the agency set up a time to come to the patient's home? yes Were any new equipment or medical supplies ordered?  No What is the name of the medical supply agency? N/a Were you able to get the supplies/equipment? not applicable Do you have any questions related to the use of the equipment or supplies? No  Functional Questionnaire: (I = Independent and D = Dependent) ADLs: I  Bathing/Dressing- I  Meal Prep- I  Eating- I  Maintaining continence- I  Transferring/Ambulation- I  Managing Meds- I  Follow up appointments reviewed:  PCP Hospital f/u appt confirmed? Yes  Scheduled to see Dr. Dennard Schaumann on 07/27/22 @ 10/30. [facility] f/u appt confirmed? Yes  Scheduled to see Dr. Benjie Karvonen on 07/11/22  Are transportation arrangements needed? No  If their condition worsens, is the pt aware to call PCP or go to the Emergency Dept.? Yes Was the patient provided with contact information for the PCP's office or ED? Yes Was to pt encouraged to call back with questions or concerns? Yes

## 2022-07-11 DIAGNOSIS — R918 Other nonspecific abnormal finding of lung field: Secondary | ICD-10-CM | POA: Diagnosis not present

## 2022-07-11 DIAGNOSIS — Z8619 Personal history of other infectious and parasitic diseases: Secondary | ICD-10-CM | POA: Diagnosis not present

## 2022-07-11 DIAGNOSIS — B441 Other pulmonary aspergillosis: Secondary | ICD-10-CM | POA: Diagnosis not present

## 2022-07-11 DIAGNOSIS — J939 Pneumothorax, unspecified: Secondary | ICD-10-CM | POA: Diagnosis not present

## 2022-07-13 ENCOUNTER — Telehealth: Payer: Self-pay

## 2022-07-13 NOTE — Telephone Encounter (Signed)
Melissa with [facility] called for verbal order to move OT evaluation to next week. Okay given and Dr. Dennard Schaumann advised.

## 2022-07-14 ENCOUNTER — Other Ambulatory Visit: Payer: Self-pay | Admitting: Family Medicine

## 2022-07-17 DIAGNOSIS — G4730 Sleep apnea, unspecified: Secondary | ICD-10-CM | POA: Diagnosis not present

## 2022-07-17 DIAGNOSIS — F419 Anxiety disorder, unspecified: Secondary | ICD-10-CM

## 2022-07-17 DIAGNOSIS — B441 Other pulmonary aspergillosis: Secondary | ICD-10-CM | POA: Diagnosis not present

## 2022-07-17 DIAGNOSIS — J309 Allergic rhinitis, unspecified: Secondary | ICD-10-CM | POA: Diagnosis not present

## 2022-07-17 DIAGNOSIS — R590 Localized enlarged lymph nodes: Secondary | ICD-10-CM | POA: Diagnosis not present

## 2022-07-17 NOTE — Telephone Encounter (Signed)
Requested medication (s) are due for refill today: -  Requested medication (s) are on the active medication list: historical med  Last refill:  08/19/21  Future visit scheduled: yes  Notes to clinic:  med was dc'd at hospital discharge on 02/27/22- please verify if wants to continue   Requested Prescriptions  Pending Prescriptions Disp Refills   escitalopram (LEXAPRO) 5 MG tablet [Pharmacy Med Name: ESCITALOPRAM 5 MG TABLET] 90 tablet 3    Sig: TAKE 1 TABLET BY MOUTH EVERYDAY AT BEDTIME     Psychiatry:  Antidepressants - SSRI Failed - 07/14/2022  5:36 PM      Failed - Valid encounter within last 6 months    Recent Outpatient Visits           8 months ago Other acute sinusitis, recurrence not specified   [place] Dennard Schaumann, Cammie Mcgee, MD   10 months ago Routine general medical examination at a health care facility   [place], Cammie Mcgee, MD   1 year ago Acute cystitis without hematuria   [place] Eulogio Bear, NP   1 year ago Exposure to confirmed case of [place], Modena Nunnery, MD   1 year ago Other acute sinusitis, recurrence not specified   [place], Modena Nunnery, MD       Future Appointments             In 1 week [place], Cammie Mcgee, MD [place]            Passed - Completed PHQ-2 or PHQ-9 in the last 360 days

## 2022-07-19 ENCOUNTER — Telehealth: Payer: Self-pay | Admitting: Family Medicine

## 2022-07-19 NOTE — Telephone Encounter (Signed)
Received voicemail message from [facility] with [facility]; stated in message patient's orthopedist wants appointment for occupational therapy evaluation moved to next Wednesday. Melissa requested for order to be sent to her confidential voicemail of [phone].  Please advise.

## 2022-07-20 DIAGNOSIS — Q324 Other congenital malformations of bronchus: Secondary | ICD-10-CM | POA: Diagnosis not present

## 2022-07-20 DIAGNOSIS — Z8619 Personal history of other infectious and parasitic diseases: Secondary | ICD-10-CM | POA: Diagnosis not present

## 2022-07-20 DIAGNOSIS — B441 Other pulmonary aspergillosis: Secondary | ICD-10-CM | POA: Diagnosis not present

## 2022-07-20 DIAGNOSIS — R918 Other nonspecific abnormal finding of lung field: Secondary | ICD-10-CM | POA: Diagnosis not present

## 2022-07-20 DIAGNOSIS — Z6823 Body mass index (BMI) 23.0-23.9, adult: Secondary | ICD-10-CM | POA: Diagnosis not present

## 2022-07-25 DIAGNOSIS — Z8619 Personal history of other infectious and parasitic diseases: Secondary | ICD-10-CM | POA: Diagnosis not present

## 2022-07-25 DIAGNOSIS — B441 Other pulmonary aspergillosis: Secondary | ICD-10-CM | POA: Diagnosis not present

## 2022-07-25 DIAGNOSIS — Z9889 Other specified postprocedural states: Secondary | ICD-10-CM | POA: Diagnosis not present

## 2022-07-26 DIAGNOSIS — Z902 Acquired absence of lung [part of]: Secondary | ICD-10-CM | POA: Diagnosis not present

## 2022-07-26 DIAGNOSIS — F419 Anxiety disorder, unspecified: Secondary | ICD-10-CM | POA: Diagnosis not present

## 2022-07-26 DIAGNOSIS — B441 Other pulmonary aspergillosis: Secondary | ICD-10-CM | POA: Diagnosis not present

## 2022-07-26 DIAGNOSIS — R918 Other nonspecific abnormal finding of lung field: Secondary | ICD-10-CM | POA: Diagnosis not present

## 2022-07-26 DIAGNOSIS — E559 Vitamin D deficiency, unspecified: Secondary | ICD-10-CM | POA: Diagnosis not present

## 2022-07-26 DIAGNOSIS — J309 Allergic rhinitis, unspecified: Secondary | ICD-10-CM | POA: Diagnosis not present

## 2022-07-26 DIAGNOSIS — F32A Depression, unspecified: Secondary | ICD-10-CM | POA: Diagnosis not present

## 2022-07-26 DIAGNOSIS — R590 Localized enlarged lymph nodes: Secondary | ICD-10-CM | POA: Diagnosis not present

## 2022-07-26 DIAGNOSIS — G4730 Sleep apnea, unspecified: Secondary | ICD-10-CM | POA: Diagnosis not present

## 2022-07-26 DIAGNOSIS — Z8701 Personal history of pneumonia (recurrent): Secondary | ICD-10-CM | POA: Diagnosis not present

## 2022-07-26 DIAGNOSIS — Z87891 Personal history of nicotine dependence: Secondary | ICD-10-CM | POA: Diagnosis not present

## 2022-07-27 ENCOUNTER — Inpatient Hospital Stay: Payer: BC Managed Care – PPO | Admitting: Family Medicine

## 2022-07-27 ENCOUNTER — Ambulatory Visit: Payer: BC Managed Care – PPO | Admitting: Family Medicine

## 2022-08-01 ENCOUNTER — Ambulatory Visit (INDEPENDENT_AMBULATORY_CARE_PROVIDER_SITE_OTHER): Payer: BC Managed Care – PPO | Admitting: Family Medicine

## 2022-08-01 ENCOUNTER — Encounter: Payer: Self-pay | Admitting: Family Medicine

## 2022-08-01 VITALS — BP 118/72 | HR 76 | Temp 98.2°F | Ht 63.0 in | Wt 137.0 lb

## 2022-08-01 DIAGNOSIS — Z23 Encounter for immunization: Secondary | ICD-10-CM

## 2022-08-01 DIAGNOSIS — Z6823 Body mass index (BMI) 23.0-23.9, adult: Secondary | ICD-10-CM | POA: Diagnosis not present

## 2022-08-01 DIAGNOSIS — Z87891 Personal history of nicotine dependence: Secondary | ICD-10-CM | POA: Diagnosis not present

## 2022-08-01 DIAGNOSIS — B449 Aspergillosis, unspecified: Secondary | ICD-10-CM

## 2022-08-01 DIAGNOSIS — D34 Benign neoplasm of thyroid gland: Secondary | ICD-10-CM | POA: Diagnosis not present

## 2022-08-01 DIAGNOSIS — E041 Nontoxic single thyroid nodule: Secondary | ICD-10-CM | POA: Diagnosis not present

## 2022-08-01 NOTE — Progress Notes (Signed)
Subjective:    Patient ID: Christina [name], female    DOB: 1966-03-26, 56 y.o.   MRN: [id number]  HPI "Christina [name] is a 56 yo woman with PMHx of incidental bronchocele (discovered ~2021) and PET-avid nodule in RUL who developed invasive pulmonary aspergillosis with Aspergillus fumigatus grown from surgical specimen of purulent material from within the bronchocele cavity that was taken during RUL lobectomy on 06/23/22. Per IDSA guidelines, there is no clear recommendation for the treatment of pulmonary aspergillosis following surgical resection. On discharge, ID consultant planned for at least 6 weeks of therapy"  Copied from recent note with ID at [facility] for my reference.   Patient currently on 6 weeks of voriconazole.  Recently had elevations in LFT's presumed due to voriconazole.  Patient has a thoracotomy scar over her right flank below her scapula that radiates from her mid scapular area around her ribs to her right mid axillary line.  She still has significant pain with coughing and deep inspiration.  She denies any fevers or chills.  She denies any shortness of breath beyond mild dyspnea due to the healing process.  She is still having to use oxycodone occasionally for pain.  She is also trying to limit her Tylenol due to recent elevations in liver function test however she seems to be doing remarkably well considering everything she has been through Past Medical History:  Diagnosis Date   Allergy    Anxiety    Hx of adenomatous and sessile serrated colonic polyps 03/19/2015   Mass of upper lobe of right lung    biopsy showed pulmoary aspergillosis   Pulmonary aspergillosis [facility])    Past Surgical History:  Procedure Laterality Date   BRONCHIAL BIOPSY  02/27/2022   Procedure: BRONCHIAL BIOPSIES;  Surgeon: Collene Gobble, MD;  Location: [place];  Service: Pulmonary;;   BRONCHIAL BRUSHINGS  02/27/2022   Procedure: BRONCHIAL BRUSHINGS;  Surgeon: Collene Gobble, MD;  Location: [facility] ENDOSCOPY;   Service: Pulmonary;;   BRONCHIAL NEEDLE ASPIRATION BIOPSY  02/27/2022   Procedure: BRONCHIAL NEEDLE ASPIRATION BIOPSIES;  Surgeon: Collene Gobble, MD;  Location: [place];  Service: Pulmonary;;   CESAREAN SECTION     CESAREAN SECTION N/A    Phreesia 07/19/2020   COLONOSCOPY  2016   ENDOBRONCHIAL ULTRASOUND  02/27/2022   Procedure: ENDOBRONCHIAL ULTRASOUND;  Surgeon: Collene Gobble, MD;  Location: [facility] ENDOSCOPY;  Service: Pulmonary;;   KNEE ARTHROSCOPY     right knee   THORACOTOMY/LOBECTOMY     right upper-pulmonary aspergillosis   VIDEO BRONCHOSCOPY WITH RADIAL ENDOBRONCHIAL ULTRASOUND  02/27/2022   Procedure: VIDEO BRONCHOSCOPY WITH RADIAL ENDOBRONCHIAL ULTRASOUND;  Surgeon: Collene Gobble, MD;  Location: [facility] ENDOSCOPY;  Service: Pulmonary;;   Current Outpatient Medications on File Prior to Visit  Medication Sig Dispense Refill   ALPRAZolam (XANAX) 0.25 MG tablet Take 1 tablet (0.25 mg total) by mouth See admin instructions. Take 0.25 mg if wake up in the middle of the night if needed 30 tablet 3   B Complex Vitamins (B COMPLEX 50 PO) Take 1 tablet by mouth daily.     cholecalciferol (VITAMIN D) 1000 units tablet Take 1,000 Units by mouth daily.     diphenhydrAMINE (BENADRYL) 25 MG tablet Take 25 mg by mouth at bedtime.     escitalopram (LEXAPRO) 10 MG tablet Take 5 mg by mouth at bedtime.     escitalopram (LEXAPRO) 5 MG tablet TAKE 1 TABLET BY MOUTH EVERYDAY AT BEDTIME 90 tablet 3  ibuprofen (ADVIL,MOTRIN) 200 MG tablet Take 200 mg by mouth every 6 (six) hours as needed for mild pain, moderate pain or headache.     meloxicam (MOBIC) 15 MG tablet TAKE 1 TABLET BY MOUTH EVERY DAY AS NEEDED FOR PAIN 30 tablet 3   valACYclovir (VALTREX) 1000 MG tablet Take 1,000 mg by mouth daily as needed (Cold sore).     zolpidem (AMBIEN) 10 MG tablet Take 1 tablet (10 mg total) by mouth at bedtime as needed for sleep. 30 tablet 1   No current facility-administered medications on file prior to  visit.   No Known Allergies Social History   Socioeconomic History   Marital status: Legally Separated    Spouse name: Not on file   Number of children: Not on file   Years of education: Not on file   Highest education level: Not on file  Occupational History   Not on file  Tobacco Use   Smoking status: Former    Types: Cigarettes    Quit date: 03/18/2002    Years since quitting: 20.3   Smokeless tobacco: Never  Vaping Use   Vaping Use: Never used  Substance and Sexual Activity   Alcohol use: Yes    Alcohol/week: 4.0 standard drinks of alcohol    Types: 4 Glasses of wine per week    Comment: 4-5 drinks per week per pt   Drug use: No   Sexual activity: Not on file  Other Topics Concern   Not on file  Social History Narrative   Not on file   Social Determinants of Health   Financial Resource Strain: Not on file  Food Insecurity: Not on file  Transportation Needs: Not on file  Physical Activity: Not on file  Stress: Not on file  Social Connections: Not on file  Intimate Partner Violence: Not on file   Family History  Problem Relation Age of Onset   Bladder Cancer Mother    Hypertension Mother    Hyperlipidemia Mother    Colon cancer Father 69   Cancer Father 50       colon cancer    Colon cancer Maternal Grandmother    Heart disease Maternal Grandmother    Bipolar disorder Sister    Heart disease Brother 40       Heart Attack   Alcohol abuse Brother    Bipolar disorder Sister    Depression Maternal Uncle    Cancer Paternal Uncle    Cancer Paternal Grandmother        Breast cancer      Review of Systems  All other systems reviewed and are negative.      Objective:   Physical Exam Constitutional:      General: She is not in acute distress.    Appearance: Normal appearance. She is normal weight. She is not ill-appearing.  HENT:     Right Ear: Tympanic membrane, ear canal and external ear normal. There is no impacted cerumen.     Left Ear: Tympanic  membrane, ear canal and external ear normal. There is no impacted cerumen.     Nose: Nose normal. No congestion or rhinorrhea.     Mouth/Throat:     Mouth: Mucous membranes are moist.     Pharynx: Oropharynx is clear. No oropharyngeal exudate or posterior oropharyngeal erythema.  Eyes:     General:        Right eye: No discharge.        Left eye: No discharge.  Extraocular Movements: Extraocular movements intact.     Conjunctiva/sclera: Conjunctivae normal.     Pupils: Pupils are equal, round, and reactive to light.  Neck:     Vascular: No carotid bruit.  Cardiovascular:     Rate and Rhythm: Normal rate and regular rhythm.     Pulses: Normal pulses.     Heart sounds: Normal heart sounds. No murmur heard.    No friction rub. No gallop.  Pulmonary:     Effort: Pulmonary effort is normal. No respiratory distress.     Breath sounds: Normal breath sounds. No stridor. No wheezing, rhonchi or rales.    Abdominal:     General: Bowel sounds are normal. There is no distension.     Palpations: Abdomen is soft.     Tenderness: There is no abdominal tenderness. There is no guarding or rebound.  Musculoskeletal:        General: Normal range of motion.     Cervical back: Normal range of motion and neck supple. No rigidity or tenderness.     Right lower leg: No edema.     Left lower leg: No edema.  Lymphadenopathy:     Cervical: No cervical adenopathy.  Skin:    General: Skin is warm.     Coloration: Skin is not jaundiced or pale.     Findings: No bruising, erythema, lesion or rash.  Neurological:     General: No focal deficit present.     Mental Status: She is alert and oriented to person, place, and time. Mental status is at baseline.     Cranial Nerves: No cranial nerve deficit.     Sensory: No sensory deficit.     Motor: No weakness.     Coordination: Coordination normal.     Gait: Gait normal.     Deep Tendon Reflexes: Reflexes normal.  Psychiatric:        Mood and Affect:  Mood normal.        Behavior: Behavior normal.        Thought Content: Thought content normal.        Judgment: Judgment normal.    Thoracotomy scar as described in the history of present illness      Assessment & Plan:   Aspergillosis, unspecified ([place]) - Plan: COMPLETE METABOLIC PANEL WITH GFR, CBC with Differential/Platelet At this point, the patient has about another 2 weeks of voriconazole until she has completed therapy.  I will monitor a CBC and a CMP today.  As long as her liver function test remain only mildly elevated I would complete the duration of treatment.  I explained to the patient that I will be willing to refill oxycodone if she has issues getting it from [facility].  She is certainly not abusing the medication.  Recommended a flu shot today also recommended a COVID booster

## 2022-08-01 NOTE — Addendum Note (Signed)
Addended by: Randal Buba K on: 08/01/2022 10:27 AM   Modules accepted: Orders

## 2022-08-02 LAB — CBC WITH DIFFERENTIAL/PLATELET
Absolute Monocytes: 676 cells/uL (ref 200–950)
Basophils Absolute: 39 cells/uL (ref 0–200)
Basophils Relative: 0.6 %
Eosinophils Absolute: 117 cells/uL (ref 15–500)
Eosinophils Relative: 1.8 %
HCT: 39.2 % (ref 35.0–45.0)
Hemoglobin: 13 g/dL (ref 11.7–15.5)
Lymphs Abs: 1827 cells/uL (ref 850–3900)
MCH: 32.1 pg (ref 27.0–33.0)
MCHC: 33.2 g/dL (ref 32.0–36.0)
MCV: 96.8 fL (ref 80.0–100.0)
MPV: 11.4 fL (ref 7.5–12.5)
Monocytes Relative: 10.4 %
Neutro Abs: 3842 cells/uL (ref 1500–7800)
Neutrophils Relative %: 59.1 %
Platelets: 273 10*3/uL (ref 140–400)
RBC: 4.05 10*6/uL (ref 3.80–5.10)
RDW: 11.9 % (ref 11.0–15.0)
Total Lymphocyte: 28.1 %
WBC: 6.5 10*3/uL (ref 3.8–10.8)

## 2022-08-02 LAB — COMPLETE METABOLIC PANEL WITH GFR
AG Ratio: 1.7 (calc) (ref 1.0–2.5)
ALT: 13 U/L (ref 6–29)
AST: 20 U/L (ref 10–35)
Albumin: 4.2 g/dL (ref 3.6–5.1)
Alkaline phosphatase (APISO): 122 U/L (ref 37–153)
BUN: 10 mg/dL (ref 7–25)
CO2: 27 mmol/L (ref 20–32)
Calcium: 9.4 mg/dL (ref 8.6–10.4)
Chloride: 106 mmol/L (ref 98–110)
Creat: 0.74 mg/dL (ref 0.50–1.03)
Globulin: 2.5 g/dL (calc) (ref 1.9–3.7)
Glucose, Bld: 83 mg/dL (ref 65–99)
Potassium: 4 mmol/L (ref 3.5–5.3)
Sodium: 141 mmol/L (ref 135–146)
Total Bilirubin: 0.3 mg/dL (ref 0.2–1.2)
Total Protein: 6.7 g/dL (ref 6.1–8.1)
eGFR: 96 mL/min/{1.73_m2} (ref >=60)

## 2022-08-03 DIAGNOSIS — Z9189 Other specified personal risk factors, not elsewhere classified: Secondary | ICD-10-CM | POA: Diagnosis not present

## 2022-08-03 DIAGNOSIS — D492 Neoplasm of unspecified behavior of bone, soft tissue, and skin: Secondary | ICD-10-CM | POA: Diagnosis not present

## 2022-08-03 DIAGNOSIS — C44212 Basal cell carcinoma of skin of right ear and external auricular canal: Secondary | ICD-10-CM | POA: Diagnosis not present

## 2022-08-03 DIAGNOSIS — Z85828 Personal history of other malignant neoplasm of skin: Secondary | ICD-10-CM | POA: Insufficient documentation

## 2022-08-03 DIAGNOSIS — Z792 Long term (current) use of antibiotics: Secondary | ICD-10-CM | POA: Diagnosis not present

## 2022-08-03 DIAGNOSIS — B440 Invasive pulmonary aspergillosis: Secondary | ICD-10-CM | POA: Diagnosis not present

## 2022-08-03 DIAGNOSIS — Z902 Acquired absence of lung [part of]: Secondary | ICD-10-CM | POA: Diagnosis not present

## 2022-08-04 ENCOUNTER — Inpatient Hospital Stay: Payer: BC Managed Care – PPO | Admitting: Family Medicine

## 2022-08-07 ENCOUNTER — Emergency Department (HOSPITAL_COMMUNITY)
Admission: EM | Admit: 2022-08-07 | Discharge: 2022-08-07 | Discharge status: Against Medical Advice | Payer: BC Managed Care – PPO

## 2022-08-09 DIAGNOSIS — E559 Vitamin D deficiency, unspecified: Secondary | ICD-10-CM | POA: Diagnosis not present

## 2022-08-09 DIAGNOSIS — Z902 Acquired absence of lung [part of]: Secondary | ICD-10-CM | POA: Diagnosis not present

## 2022-08-09 DIAGNOSIS — Z8701 Personal history of pneumonia (recurrent): Secondary | ICD-10-CM | POA: Diagnosis not present

## 2022-08-09 DIAGNOSIS — R918 Other nonspecific abnormal finding of lung field: Secondary | ICD-10-CM | POA: Diagnosis not present

## 2022-08-09 DIAGNOSIS — Z87891 Personal history of nicotine dependence: Secondary | ICD-10-CM | POA: Diagnosis not present

## 2022-08-09 DIAGNOSIS — G4730 Sleep apnea, unspecified: Secondary | ICD-10-CM | POA: Diagnosis not present

## 2022-08-09 DIAGNOSIS — R590 Localized enlarged lymph nodes: Secondary | ICD-10-CM | POA: Diagnosis not present

## 2022-08-09 DIAGNOSIS — J309 Allergic rhinitis, unspecified: Secondary | ICD-10-CM | POA: Diagnosis not present

## 2022-08-09 DIAGNOSIS — F32A Depression, unspecified: Secondary | ICD-10-CM | POA: Diagnosis not present

## 2022-08-09 DIAGNOSIS — F419 Anxiety disorder, unspecified: Secondary | ICD-10-CM | POA: Diagnosis not present

## 2022-08-09 DIAGNOSIS — B441 Other pulmonary aspergillosis: Secondary | ICD-10-CM | POA: Diagnosis not present

## 2022-08-10 ENCOUNTER — Other Ambulatory Visit: Payer: Self-pay | Admitting: Family Medicine

## 2022-08-10 ENCOUNTER — Telehealth: Payer: Self-pay

## 2022-08-10 DIAGNOSIS — Z902 Acquired absence of lung [part of]: Secondary | ICD-10-CM | POA: Diagnosis not present

## 2022-08-10 DIAGNOSIS — J9809 Other diseases of bronchus, not elsewhere classified: Secondary | ICD-10-CM | POA: Diagnosis not present

## 2022-08-10 DIAGNOSIS — R918 Other nonspecific abnormal finding of lung field: Secondary | ICD-10-CM | POA: Diagnosis not present

## 2022-08-10 DIAGNOSIS — J90 Pleural effusion, not elsewhere classified: Secondary | ICD-10-CM | POA: Diagnosis not present

## 2022-08-10 DIAGNOSIS — Z9889 Other specified postprocedural states: Secondary | ICD-10-CM | POA: Diagnosis not present

## 2022-08-10 DIAGNOSIS — J9811 Atelectasis: Secondary | ICD-10-CM | POA: Diagnosis not present

## 2022-08-10 MED ORDER — NIRMATRELVIR/RITONAVIR (PAXLOVID)TABLET: 3.0000 | ORAL_TABLET | Freq: Two times a day (BID) | ORAL | 0 refills | Status: AC

## 2022-08-10 NOTE — Telephone Encounter (Signed)
Pt called in stating that she was feeling unwell this morning. Pt states she took a Covid test and had a + result. Can a medication be sent in for her?

## 2022-08-18 ENCOUNTER — Other Ambulatory Visit: Payer: Self-pay | Admitting: Family Medicine

## 2022-08-18 MED ORDER — OXYCODONE-ACETAMINOPHEN 5-325 MG PO TABS: 1.0000 | ORAL_TABLET | ORAL | 0 refills | Status: AC | PRN

## 2022-08-25 ENCOUNTER — Encounter: Payer: Self-pay | Admitting: Family Medicine

## 2022-08-25 DIAGNOSIS — J309 Allergic rhinitis, unspecified: Secondary | ICD-10-CM | POA: Diagnosis not present

## 2022-08-25 DIAGNOSIS — E559 Vitamin D deficiency, unspecified: Secondary | ICD-10-CM | POA: Diagnosis not present

## 2022-08-25 DIAGNOSIS — F32A Depression, unspecified: Secondary | ICD-10-CM | POA: Diagnosis not present

## 2022-08-25 DIAGNOSIS — B441 Other pulmonary aspergillosis: Secondary | ICD-10-CM | POA: Diagnosis not present

## 2022-08-25 DIAGNOSIS — Z8701 Personal history of pneumonia (recurrent): Secondary | ICD-10-CM | POA: Diagnosis not present

## 2022-08-25 DIAGNOSIS — R590 Localized enlarged lymph nodes: Secondary | ICD-10-CM | POA: Diagnosis not present

## 2022-08-25 DIAGNOSIS — F419 Anxiety disorder, unspecified: Secondary | ICD-10-CM | POA: Diagnosis not present

## 2022-08-25 DIAGNOSIS — G4730 Sleep apnea, unspecified: Secondary | ICD-10-CM | POA: Diagnosis not present

## 2022-08-25 DIAGNOSIS — Z902 Acquired absence of lung [part of]: Secondary | ICD-10-CM | POA: Diagnosis not present

## 2022-08-25 DIAGNOSIS — Z87891 Personal history of nicotine dependence: Secondary | ICD-10-CM | POA: Diagnosis not present

## 2022-08-25 DIAGNOSIS — R918 Other nonspecific abnormal finding of lung field: Secondary | ICD-10-CM | POA: Diagnosis not present

## 2022-08-28 ENCOUNTER — Other Ambulatory Visit: Payer: Self-pay | Admitting: Family Medicine

## 2022-08-28 DIAGNOSIS — M25511 Pain in right shoulder: Secondary | ICD-10-CM

## 2022-08-31 DIAGNOSIS — L814 Other melanin hyperpigmentation: Secondary | ICD-10-CM | POA: Diagnosis not present

## 2022-08-31 DIAGNOSIS — C44311 Basal cell carcinoma of skin of nose: Secondary | ICD-10-CM | POA: Diagnosis not present

## 2022-08-31 DIAGNOSIS — C44212 Basal cell carcinoma of skin of right ear and external auricular canal: Secondary | ICD-10-CM | POA: Diagnosis not present

## 2022-08-31 DIAGNOSIS — L578 Other skin changes due to chronic exposure to nonionizing radiation: Secondary | ICD-10-CM | POA: Diagnosis not present

## 2022-09-06 DIAGNOSIS — R293 Abnormal posture: Secondary | ICD-10-CM | POA: Diagnosis not present

## 2022-09-06 DIAGNOSIS — M62511 Muscle wasting and atrophy, not elsewhere classified, right shoulder: Secondary | ICD-10-CM | POA: Diagnosis not present

## 2022-09-06 DIAGNOSIS — M25511 Pain in right shoulder: Secondary | ICD-10-CM | POA: Diagnosis not present

## 2022-09-06 DIAGNOSIS — M25611 Stiffness of right shoulder, not elsewhere classified: Secondary | ICD-10-CM | POA: Diagnosis not present

## 2022-09-07 DIAGNOSIS — M25511 Pain in right shoulder: Secondary | ICD-10-CM | POA: Diagnosis not present

## 2022-09-07 DIAGNOSIS — M25611 Stiffness of right shoulder, not elsewhere classified: Secondary | ICD-10-CM | POA: Diagnosis not present

## 2022-09-07 DIAGNOSIS — M62511 Muscle wasting and atrophy, not elsewhere classified, right shoulder: Secondary | ICD-10-CM | POA: Diagnosis not present

## 2022-09-07 DIAGNOSIS — R293 Abnormal posture: Secondary | ICD-10-CM | POA: Diagnosis not present

## 2022-09-11 ENCOUNTER — Other Ambulatory Visit: Payer: BC Managed Care – PPO

## 2022-09-11 DIAGNOSIS — M25511 Pain in right shoulder: Secondary | ICD-10-CM | POA: Diagnosis not present

## 2022-09-11 DIAGNOSIS — M62511 Muscle wasting and atrophy, not elsewhere classified, right shoulder: Secondary | ICD-10-CM | POA: Diagnosis not present

## 2022-09-11 DIAGNOSIS — M25611 Stiffness of right shoulder, not elsewhere classified: Secondary | ICD-10-CM | POA: Diagnosis not present

## 2022-09-11 DIAGNOSIS — R293 Abnormal posture: Secondary | ICD-10-CM | POA: Diagnosis not present

## 2022-09-13 DIAGNOSIS — R293 Abnormal posture: Secondary | ICD-10-CM | POA: Diagnosis not present

## 2022-09-13 DIAGNOSIS — M62511 Muscle wasting and atrophy, not elsewhere classified, right shoulder: Secondary | ICD-10-CM | POA: Diagnosis not present

## 2022-09-13 DIAGNOSIS — M25511 Pain in right shoulder: Secondary | ICD-10-CM | POA: Diagnosis not present

## 2022-09-13 DIAGNOSIS — M25611 Stiffness of right shoulder, not elsewhere classified: Secondary | ICD-10-CM | POA: Diagnosis not present

## 2022-09-14 ENCOUNTER — Encounter: Payer: BC Managed Care – PPO | Admitting: Family Medicine

## 2022-09-18 DIAGNOSIS — M62511 Muscle wasting and atrophy, not elsewhere classified, right shoulder: Secondary | ICD-10-CM | POA: Diagnosis not present

## 2022-09-18 DIAGNOSIS — M25511 Pain in right shoulder: Secondary | ICD-10-CM | POA: Diagnosis not present

## 2022-09-18 DIAGNOSIS — R293 Abnormal posture: Secondary | ICD-10-CM | POA: Diagnosis not present

## 2022-09-18 DIAGNOSIS — M25611 Stiffness of right shoulder, not elsewhere classified: Secondary | ICD-10-CM | POA: Diagnosis not present

## 2022-09-19 ENCOUNTER — Telehealth: Payer: Self-pay | Admitting: Internal Medicine

## 2022-09-19 NOTE — Telephone Encounter (Signed)
Scheduled colon with patient for 10/11/22 at 2:00 pm in the [facility] with Dr. Carlean Purl. PV scheduled for 09/27/22 at 1:00 pm. Pt advised on when/where to go. No blood thinners or diabetic.

## 2022-09-19 NOTE — Telephone Encounter (Signed)
Saw patient at husband's colonoscopy appointment  Requesting colonoscopy before end of year due to deductibles being met.  This is reasonable so please schedule colonoscopy in one of my open December appointments, if possible  Dx  Hx colon polyps + family hx CRCA

## 2022-09-25 DIAGNOSIS — M62511 Muscle wasting and atrophy, not elsewhere classified, right shoulder: Secondary | ICD-10-CM | POA: Diagnosis not present

## 2022-09-25 DIAGNOSIS — M25611 Stiffness of right shoulder, not elsewhere classified: Secondary | ICD-10-CM | POA: Diagnosis not present

## 2022-09-25 DIAGNOSIS — M25511 Pain in right shoulder: Secondary | ICD-10-CM | POA: Diagnosis not present

## 2022-09-25 DIAGNOSIS — R293 Abnormal posture: Secondary | ICD-10-CM | POA: Diagnosis not present

## 2022-09-26 ENCOUNTER — Other Ambulatory Visit: Payer: BC Managed Care – PPO

## 2022-09-26 ENCOUNTER — Other Ambulatory Visit: Payer: Self-pay | Admitting: Family Medicine

## 2022-09-26 MED ORDER — ZOLPIDEM TARTRATE 10 MG PO TABS: 10.0000 mg | ORAL_TABLET | Freq: Every evening | ORAL | 1 refills | Status: DC | PRN

## 2022-09-26 NOTE — Telephone Encounter (Signed)
Pt called requesting a refill on Ambien 10 mg and on Oxycodone. Pt states she had some increased pain at her surgical site. Pt states she has placed a call to her MD at [facility] to have a CT scan done. Thank you.

## 2022-09-27 ENCOUNTER — Telehealth: Payer: Self-pay

## 2022-09-27 ENCOUNTER — Encounter: Payer: Self-pay | Admitting: Internal Medicine

## 2022-09-27 ENCOUNTER — Ambulatory Visit (INDEPENDENT_AMBULATORY_CARE_PROVIDER_SITE_OTHER): Payer: BC Managed Care – PPO | Admitting: Family Medicine

## 2022-09-27 ENCOUNTER — Other Ambulatory Visit: Payer: Self-pay | Admitting: Family Medicine

## 2022-09-27 ENCOUNTER — Encounter: Payer: Self-pay | Admitting: Family Medicine

## 2022-09-27 ENCOUNTER — Ambulatory Visit (AMBULATORY_SURGERY_CENTER): Payer: Self-pay | Admitting: *Deleted

## 2022-09-27 VITALS — BP 116/70 | HR 77 | Temp 97.7°F | Ht 63.0 in | Wt 140.0 lb

## 2022-09-27 VITALS — Ht 63.0 in | Wt 136.0 lb

## 2022-09-27 DIAGNOSIS — M62838 Other muscle spasm: Secondary | ICD-10-CM | POA: Diagnosis not present

## 2022-09-27 DIAGNOSIS — Z86010 Personal history of colonic polyps: Secondary | ICD-10-CM

## 2022-09-27 DIAGNOSIS — Z0000 Encounter for general adult medical examination without abnormal findings: Secondary | ICD-10-CM | POA: Diagnosis not present

## 2022-09-27 LAB — COMPLETE METABOLIC PANEL WITH GFR
AG Ratio: 1.6 (calc) (ref 1.0–2.5)
ALT: 13 U/L (ref 6–29)
AST: 16 U/L (ref 10–35)
Albumin: 4.1 g/dL (ref 3.6–5.1)
Alkaline phosphatase (APISO): 71 U/L (ref 37–153)
BUN: 9 mg/dL (ref 7–25)
CO2: 28 mmol/L (ref 20–32)
Calcium: 9.3 mg/dL (ref 8.6–10.4)
Chloride: 105 mmol/L (ref 98–110)
Creat: 0.68 mg/dL (ref 0.50–1.03)
Globulin: 2.5 g/dL (calc) (ref 1.9–3.7)
Glucose, Bld: 86 mg/dL (ref 65–99)
Potassium: 4.2 mmol/L (ref 3.5–5.3)
Sodium: 141 mmol/L (ref 135–146)
Total Bilirubin: 0.2 mg/dL (ref 0.2–1.2)
Total Protein: 6.6 g/dL (ref 6.1–8.1)
eGFR: 103 mL/min/{1.73_m2} (ref >=60)

## 2022-09-27 LAB — TSH: TSH: 1.46 mIU/L

## 2022-09-27 LAB — CBC WITH DIFFERENTIAL/PLATELET
Absolute Monocytes: 347 cells/uL (ref 200–950)
Basophils Absolute: 31 cells/uL (ref 0–200)
Basophils Relative: 0.8 %
Eosinophils Absolute: 78 cells/uL (ref 15–500)
Eosinophils Relative: 2 %
HCT: 38.2 % (ref 35.0–45.0)
Hemoglobin: 12.5 g/dL (ref 11.7–15.5)
Lymphs Abs: 1712 cells/uL (ref 850–3900)
MCH: 31.2 pg (ref 27.0–33.0)
MCHC: 32.7 g/dL (ref 32.0–36.0)
MCV: 95.3 fL (ref 80.0–100.0)
MPV: 11.1 fL (ref 7.5–12.5)
Monocytes Relative: 8.9 %
Neutro Abs: 1732 cells/uL (ref 1500–7800)
Neutrophils Relative %: 44.4 %
Platelets: 227 10*3/uL (ref 140–400)
RBC: 4.01 10*6/uL (ref 3.80–5.10)
RDW: 12.5 % (ref 11.0–15.0)
Total Lymphocyte: 43.9 %
WBC: 3.9 10*3/uL (ref 3.8–10.8)

## 2022-09-27 LAB — LIPID PANEL
Cholesterol: 203 mg/dL — ABNORMAL HIGH (ref <200)
HDL: 83 mg/dL (ref >=50)
LDL Cholesterol (Calc): 105 mg/dL (calc) — ABNORMAL HIGH
Non-HDL Cholesterol (Calc): 120 mg/dL (calc) (ref <130)
Total CHOL/HDL Ratio: 2.4 (calc) (ref <5.0)
Triglycerides: 63 mg/dL (ref <150)

## 2022-09-27 LAB — VITAMIN D 25 HYDROXY (VIT D DEFICIENCY, FRACTURES): Vit D, 25-Hydroxy: 29 ng/mL — ABNORMAL LOW (ref 30–100)

## 2022-09-27 MED ORDER — NA SULFATE-K SULFATE-MG SULF 17.5-3.13-1.6 GM/177ML PO SOLN: 1.0000 | Freq: Once | ORAL | 0 refills | Status: AC

## 2022-09-27 MED ORDER — HYDROCODONE-ACETAMINOPHEN 5-325 MG PO TABS: 1.0000 | ORAL_TABLET | Freq: Four times a day (QID) | ORAL | 0 refills | Status: DC | PRN

## 2022-09-27 MED ORDER — CYCLOBENZAPRINE HCL 5 MG PO TABS: 5.0000 mg | ORAL_TABLET | Freq: Three times a day (TID) | ORAL | 0 refills | Status: DC | PRN

## 2022-09-27 NOTE — Progress Notes (Signed)
No egg or soy allergy known to patient  No issues known to pt with past sedation with any surgeries or procedures Patient denies ever being told they had issues or difficulty with intubation  No FH of Malignant Hyperthermia Pt is not on diet pills Pt is not on  home 02  Pt is not on blood thinners  Pt denies issues with constipation  Pt encouraged to use to use Singlecare or Goodrx to reduce cost  Patient's chart reviewed by Osvaldo Angst CNRA prior to previsit and patient appropriate for the [place].  Previsit completed and red dot placed by patient's name on their procedure day (on provider's schedule).  . In person visit

## 2022-09-27 NOTE — Progress Notes (Signed)
Complete physical exam  Patient: Christina [name]   DOB: 10/04/1966   56 y.o. Female  MRN: [id number]  Subjective:    Chief Complaint  Patient presents with   Annual Exam    Christina [name] is a 56 y.o. female who presents today for a complete physical exam. She reports consuming a general diet. Exercise is limited by recent surgery. She generally feels fairly well. She reports sleeping well. She does have additional problems to discuss today including paperwork for leave from work as well as pain to her right chest surgical site. She has been doing physical therapy 2x weekly for 3 weeks and did a gym workout 1 week ago and reports increasing soreness. She contacted her surgeon who she will follow up with for imaging and a visit to evaluate for possible muscle retraction. She has tried Advil and Tylenol. [name] also needs medical leave forms completed for work.   Most recent fall risk assessment:    09/27/2022    9:26 AM  [place] in the past year? 0     Most recent depression screenings:    09/27/2022    9:26 AM 09/12/2021    3:01 PM  PHQ 2/9 Scores  PHQ - 2 Score 0 0  PHQ- 9 Score 0 2    Dental: No current dental problems  Past Medical History:  Diagnosis Date   Allergy    Anxiety    Cancer ([place])    Depression    Hx of adenomatous and sessile serrated colonic polyps 03/19/2015   Hyperlipidemia    Mass of upper lobe of right lung    biopsy showed pulmoary aspergillosis   Pulmonary aspergillosis [facility])    Past Surgical History:  Procedure Laterality Date   BRONCHIAL BIOPSY  02/27/2022   Procedure: BRONCHIAL BIOPSIES;  Surgeon: Collene Gobble, MD;  [facility] ENDOSCOPY;  Service: Pulmonary;;   BRONCHIAL BRUSHINGS  02/27/2022   Procedure: BRONCHIAL BRUSHINGS;  Surgeon: Collene Gobble, MD;  Location: [facility] ENDOSCOPY;  Service: Pulmonary;;   BRONCHIAL NEEDLE ASPIRATION BIOPSY  02/27/2022   Procedure: BRONCHIAL NEEDLE ASPIRATION BIOPSIES;  Surgeon: Collene Gobble, MD;  Location: [facility] ENDOSCOPY;  Service: Pulmonary;;   CESAREAN SECTION     CESAREAN SECTION N/A    Phreesia 07/19/2020   COLONOSCOPY  2016   ENDOBRONCHIAL ULTRASOUND  02/27/2022   Procedure: ENDOBRONCHIAL ULTRASOUND;  Surgeon: Collene Gobble, MD;  Location: [facility] ENDOSCOPY;  Service: Pulmonary;;   KNEE ARTHROSCOPY     right knee   THORACOTOMY/LOBECTOMY     right upper-pulmonary aspergillosis   VIDEO BRONCHOSCOPY WITH RADIAL ENDOBRONCHIAL ULTRASOUND  02/27/2022   Procedure: VIDEO BRONCHOSCOPY WITH RADIAL ENDOBRONCHIAL ULTRASOUND;  Surgeon: Collene Gobble, MD;  [facility] ENDOSCOPY;  Service: Pulmonary;;   Family History  Problem Relation Age of Onset   Bladder Cancer Mother    Hypertension Mother    Hyperlipidemia Mother    Colon cancer Father 2   Cancer Father 15       colon cancer    Bipolar disorder Sister    Bipolar disorder Sister    Heart disease Brother 2       Heart Attack   Alcohol abuse Brother    Depression Maternal Uncle    Cancer Paternal Uncle    Colon cancer Maternal Grandmother    Heart disease Maternal Grandmother    Cancer Paternal Grandmother        Breast cancer  Colon polyps Neg Hx    Esophageal cancer Neg Hx    Rectal cancer Neg Hx    Stomach cancer Neg Hx    No Known Allergies    Patient Care Team: Susy Frizzle, MD as PCP - General (Family Medicine)   Outpatient Medications Prior to Visit  Medication Sig   ALPRAZolam (XANAX) 0.25 MG tablet Take 1 tablet (0.25 mg total) by mouth See admin instructions. Take 0.25 mg if wake up in the middle of the night if needed   B Complex Vitamins (B COMPLEX 50 PO) Take 1 tablet by mouth daily.   cholecalciferol (VITAMIN D) 1000 units tablet Take 1,000 Units by mouth daily.   diphenhydrAMINE (BENADRYL) 25 MG tablet Take 25 mg by mouth at bedtime.   docusate sodium (COLACE) 100 MG capsule Take 100 mg by mouth 2 (two) times daily.   escitalopram (LEXAPRO) 5 MG tablet TAKE 1 TABLET BY MOUTH EVERYDAY  AT BEDTIME   ibuprofen (ADVIL,MOTRIN) 200 MG tablet Take 200 mg by mouth every 6 (six) hours as needed for mild pain, moderate pain or headache.   meloxicam (MOBIC) 15 MG tablet TAKE 1 TABLET BY MOUTH EVERY DAY AS NEEDED FOR PAIN   valACYclovir (VALTREX) 1000 MG tablet Take 1,000 mg by mouth daily as needed (Cold sore).   zolpidem (AMBIEN) 10 MG tablet Take 1 tablet (10 mg total) by mouth at bedtime as needed for sleep.   [DISCONTINUED] gabapentin (NEURONTIN) 100 MG capsule Take 100 mg by mouth at bedtime.   [DISCONTINUED] voriconazole (VFEND) 200 MG tablet Take 200 mg by mouth 2 (two) times daily.   [DISCONTINUED] escitalopram (LEXAPRO) 10 MG tablet Take 5 mg by mouth at bedtime. (Patient not taking: Reported on 09/27/2022)   No facility-administered medications prior to visit.    Review of Systems  Constitutional: Negative.   HENT: Negative.    Eyes: Negative.   Respiratory: Negative.    Cardiovascular: Negative.   Gastrointestinal: Negative.   Genitourinary: Negative.   Musculoskeletal: Negative.   Skin: Negative.   Neurological: Negative.   Endo/Heme/Allergies: Negative.   Psychiatric/Behavioral: Negative.    All other systems reviewed and are negative.         Objective:     BP 116/70   Pulse 77   Temp 97.7 F (36.5 C) (Oral)   Ht _0  (1.6 m)   Wt 140 lb (63.5 kg)   LMP 03/04/2015   SpO2 100%   BMI 24.80 kg/m  BP Readings from Last 3 Encounters:  09/27/22 116/70  08/01/22 118/72  06/16/22 105/64   Wt Readings from Last 3 Encounters:  09/27/22 136 lb (61.7 kg)  09/27/22 140 lb (63.5 kg)  08/01/22 137 lb (62.1 kg)      Physical Exam   No results found for any visits on 09/27/22. Last CBC Lab Results  Component Value Date   WBC 3.9 09/26/2022   HGB 12.5 09/26/2022   HCT 38.2 09/26/2022   MCV 95.3 09/26/2022   MCH 31.2 09/26/2022   RDW 12.5 09/26/2022   PLT 227 37/34/2876   Last metabolic panel Lab Results  Component Value Date   GLUCOSE 86  09/26/2022   NA 141 09/26/2022   K 4.2 09/26/2022   CL 105 09/26/2022   CO2 28 09/26/2022   BUN 9 09/26/2022   CREATININE 0.68 09/26/2022   EGFR 103 09/26/2022   CALCIUM 9.3 09/26/2022   PROT 6.6 09/26/2022   BILITOT 0.2 09/26/2022   AST 16 09/26/2022  ALT 13 09/26/2022   Last lipids Lab Results  Component Value Date   CHOL 203 (H) 09/26/2022   HDL 83 09/26/2022   LDLCALC 105 (H) 09/26/2022   TRIG 63 09/26/2022   CHOLHDL 2.4 09/26/2022   Last hemoglobin A1c No results found for: "HGBA1C" Last thyroid functions Lab Results  Component Value Date   TSH 1.46 09/26/2022   Last vitamin D Lab Results  Component Value Date   VD25OH 29 (L) 09/26/2022     The 10-year ASCVD risk score (Arnett DK, et al., 2019) is: 1%   Values used to calculate the score:     Age: 42 years     Sex: Female     Is Non-Hispanic African American: No     Diabetic: No     Tobacco smoker: No     Systolic Blood Pressure: 256 mmHg     Is BP treated: No     HDL Cholesterol: 83 mg/dL     Total Cholesterol: 203 mg/dL    Assessment & Plan:    Routine Health Maintenance and Physical Exam  Immunization History  Administered Date(s) Administered   Influenza, Quadrivalent, Recombinant, Inj, Pf 08/10/2019   Influenza,inj,Quad PF,6+ Mos 09/13/2018, 07/19/2020, 09/04/2021, 08/01/2022   Influenza-Unspecified 06/30/2021, 09/04/2021   Moderna Sars-Covid-2 Vaccination 01/07/2020, 02/11/2020, 10/04/2020   Pfizer Covid-19 Vaccine Bivalent Booster 52yr & up 10/14/2021   Tdap 09/25/2017   Zoster Recombinat (Shingrix) 12/10/2019, 05/31/2022    Health Maintenance  Topic Date Due   COVID-19 Vaccine (5 - 2023-24 season) 10/29/2022 (Originally 06/30/2022)   PAP SMEAR-Modifier  08/02/2023 (Originally 11/13/2021)   MAMMOGRAM  12/24/2022   COLONOSCOPY (Pts 45-431yrInsurance coverage will need to be confirmed)  04/04/2023   INFLUENZA VACCINE  Completed   Hepatitis C Screening  Completed   HIV Screening   Completed   Zoster Vaccines- Shingrix  Completed   HPV VACCINES  Aged Out    Discussed health benefits of physical activity, and encouraged her to engage in regular exercise appropriate for her age and condition.  Problem List Items Addressed This Visit   None Visit Diagnoses     Muscle spasm    -  Primary   Physical exam, annual          No follow-ups on file.     AmRubie MaidFNP

## 2022-09-27 NOTE — Assessment & Plan Note (Signed)
Physical exam completed today and labs reviewed. Discussed importance of heart healthy diet such as DASH or Mediterranean diet and exercise as tolerated. I have completed a form for work to extend her leave for an additional month due to her recent increase in pain with activity until further evaluation by her surgeon. She has a follow up with her surgeon and a planned PET/CT/MRI to evaluate etiology of this pain. Start Flexeril nightly before physical therapy. PAP declined, vaccine UTD, health maintenance UTD.

## 2022-09-27 NOTE — Telephone Encounter (Signed)
Pt called requesting a refill on her Vicodin yesterday. I attached the note to her Ambien refill. Pt was seen by [employer] today and was given an rx for Flexeril and was told to continue her Mobic. Thank you.

## 2022-09-28 DIAGNOSIS — M62511 Muscle wasting and atrophy, not elsewhere classified, right shoulder: Secondary | ICD-10-CM | POA: Diagnosis not present

## 2022-09-28 DIAGNOSIS — M25511 Pain in right shoulder: Secondary | ICD-10-CM | POA: Diagnosis not present

## 2022-09-28 DIAGNOSIS — R293 Abnormal posture: Secondary | ICD-10-CM | POA: Diagnosis not present

## 2022-09-28 DIAGNOSIS — M25611 Stiffness of right shoulder, not elsewhere classified: Secondary | ICD-10-CM | POA: Diagnosis not present

## 2022-10-02 DIAGNOSIS — M25511 Pain in right shoulder: Secondary | ICD-10-CM | POA: Diagnosis not present

## 2022-10-02 DIAGNOSIS — R293 Abnormal posture: Secondary | ICD-10-CM | POA: Diagnosis not present

## 2022-10-02 DIAGNOSIS — M25611 Stiffness of right shoulder, not elsewhere classified: Secondary | ICD-10-CM | POA: Diagnosis not present

## 2022-10-02 DIAGNOSIS — M62511 Muscle wasting and atrophy, not elsewhere classified, right shoulder: Secondary | ICD-10-CM | POA: Diagnosis not present

## 2022-10-03 ENCOUNTER — Telehealth: Payer: Self-pay

## 2022-10-03 NOTE — Telephone Encounter (Signed)
Please see Christina [name]'s msg. Thank you

## 2022-10-03 NOTE — Telephone Encounter (Signed)
Pls advice  

## 2022-10-03 NOTE — Telephone Encounter (Signed)
Pt called stating that she is in need of an extension on her medical leave. Pt states she dropped off notes from her Physical Therapist. Pt asks if there is anything else you may need from her to extend her leave. Thank you.

## 2022-10-03 NOTE — Telephone Encounter (Signed)
Spoke with patient to inform her the forms are completed and ready for pickup.

## 2022-10-06 DIAGNOSIS — M62511 Muscle wasting and atrophy, not elsewhere classified, right shoulder: Secondary | ICD-10-CM | POA: Diagnosis not present

## 2022-10-06 DIAGNOSIS — R293 Abnormal posture: Secondary | ICD-10-CM | POA: Diagnosis not present

## 2022-10-06 DIAGNOSIS — M25611 Stiffness of right shoulder, not elsewhere classified: Secondary | ICD-10-CM | POA: Diagnosis not present

## 2022-10-06 DIAGNOSIS — M25511 Pain in right shoulder: Secondary | ICD-10-CM | POA: Diagnosis not present

## 2022-10-09 DIAGNOSIS — M25511 Pain in right shoulder: Secondary | ICD-10-CM | POA: Diagnosis not present

## 2022-10-09 DIAGNOSIS — M62511 Muscle wasting and atrophy, not elsewhere classified, right shoulder: Secondary | ICD-10-CM | POA: Diagnosis not present

## 2022-10-09 DIAGNOSIS — M25611 Stiffness of right shoulder, not elsewhere classified: Secondary | ICD-10-CM | POA: Diagnosis not present

## 2022-10-09 DIAGNOSIS — R293 Abnormal posture: Secondary | ICD-10-CM | POA: Diagnosis not present

## 2022-10-10 ENCOUNTER — Encounter: Payer: Self-pay | Admitting: Family Medicine

## 2022-10-10 NOTE — Progress Notes (Signed)
[facility] History and Physical   Primary Care Physician:  [name], MD   Reason for Procedure:   Hx colon polyps + FHx CRCA  Plan:    colonoscopy     HPI: Christina [name] is a 56 y.o. female here for surveillance colonoscopy 02/2015 - 20 mm TA, small TA and small ssp no dysplasia  04/03/2018 diminutive right colon polyps x 2 adenomas  Also has FHx CRCA  Past Medical History:  Diagnosis Date   Allergy    Anxiety    Cancer (HCC)    Depression    Hx of adenomatous and sessile serrated colonic polyps 03/19/2015   Hyperlipidemia    Mass of upper lobe of right lung    biopsy showed pulmoary aspergillosis   Pulmonary aspergillosis (HCC)     Past Surgical History:  Procedure Laterality Date   BRONCHIAL BIOPSY  02/27/2022   Procedure: BRONCHIAL BIOPSIES;  Surgeon: Leslye Peer, MD;  Location: [facility] ENDOSCOPY;  Service: Pulmonary;;   BRONCHIAL BRUSHINGS  02/27/2022   Procedure: BRONCHIAL BRUSHINGS;  Surgeon: Leslye Peer, MD;  Location: [facility] ENDOSCOPY;  Service: Pulmonary;;   BRONCHIAL NEEDLE ASPIRATION BIOPSY  02/27/2022   Procedure: BRONCHIAL NEEDLE ASPIRATION BIOPSIES;  Surgeon: Leslye Peer, MD;  Location: [facility] ENDOSCOPY;  Service: Pulmonary;;   CESAREAN SECTION     CESAREAN SECTION N/A    [employer] 07/19/2020   COLONOSCOPY  2016   ENDOBRONCHIAL ULTRASOUND  02/27/2022   Procedure: ENDOBRONCHIAL ULTRASOUND;  Surgeon: Leslye Peer, MD;  Location: [facility] ENDOSCOPY;  Service: Pulmonary;;   KNEE ARTHROSCOPY     right knee   THORACOTOMY/LOBECTOMY     right upper-pulmonary aspergillosis   VIDEO BRONCHOSCOPY WITH RADIAL ENDOBRONCHIAL ULTRASOUND  02/27/2022   Procedure: VIDEO BRONCHOSCOPY WITH RADIAL ENDOBRONCHIAL ULTRASOUND;  Surgeon: Leslye Peer, MD;  Location: [facility] ENDOSCOPY;  Service: Pulmonary;;    Prior to Admission medications   Medication Sig Start Date End Date Taking? Authorizing Provider  ALPRAZolam (XANAX) 0.25 MG tablet Take 1 tablet (0.25 mg  total) by mouth See admin instructions. Take 0.25 mg if wake up in the middle of the night if needed 05/11/22   [name], MD  B Complex Vitamins (B COMPLEX 50 PO) Take 1 tablet by mouth daily.    [provider]  cholecalciferol (VITAMIN D) 1000 units tablet Take 1,000 Units by mouth daily.    [provider]  cyclobenzaprine (FLEXERIL) 5 MG tablet Take 1 tablet (5 mg total) by mouth 3 (three) times daily as needed for muscle spasms. 09/27/22   Park Meo, FNP  diphenhydrAMINE (BENADRYL) 25 MG tablet Take 25 mg by mouth at bedtime.    [provider]  docusate sodium (COLACE) 100 MG capsule Take 100 mg by mouth 2 (two) times daily.    [provider]  escitalopram (LEXAPRO) 5 MG tablet TAKE 1 TABLET BY MOUTH EVERYDAY AT BEDTIME 07/17/22   [name], MD  HYDROcodone-acetaminophen (NORCO) 5-325 MG tablet Take 1 tablet by mouth every 6 (six) hours as needed for moderate pain. 09/27/22   [name], MD  ibuprofen (ADVIL,MOTRIN) 200 MG tablet Take 200 mg by mouth every 6 (six) hours as needed for mild pain, moderate pain or headache.    [provider]  meloxicam (MOBIC) 15 MG tablet TAKE 1 TABLET BY MOUTH EVERY DAY AS NEEDED FOR PAIN 03/21/21   [name], MD  valACYclovir (VALTREX) 1000 MG tablet Take 1,000 mg by  mouth daily as needed (Cold sore). 10/28/21   [provider]  zolpidem (AMBIEN) 10 MG tablet Take 1 tablet (10 mg total) by mouth at bedtime as needed for sleep. 09/26/22 10/26/22  [name], MD    Current Outpatient Medications  Medication Sig Dispense Refill   ALPRAZolam (XANAX) 0.25 MG tablet Take 1 tablet (0.25 mg total) by mouth See admin instructions. Take 0.25 mg if wake up in the middle of the night if needed 30 tablet 3   B Complex Vitamins (B COMPLEX 50 PO) Take 1 tablet by mouth daily.     cholecalciferol (VITAMIN D) 1000 units tablet Take 1,000 Units by mouth daily.     cyclobenzaprine  (FLEXERIL) 5 MG tablet Take 1 tablet (5 mg total) by mouth 3 (three) times daily as needed for muscle spasms. 10 tablet 0   diphenhydrAMINE (BENADRYL) 25 MG tablet Take 25 mg by mouth at bedtime.     escitalopram (LEXAPRO) 5 MG tablet TAKE 1 TABLET BY MOUTH EVERYDAY AT BEDTIME 90 tablet 3   zolpidem (AMBIEN) 10 MG tablet Take 1 tablet (10 mg total) by mouth at bedtime as needed for sleep. 15 tablet 1   docusate sodium (COLACE) 100 MG capsule Take 100 mg by mouth 2 (two) times daily.     HYDROcodone-acetaminophen (NORCO) 5-325 MG tablet Take 1 tablet by mouth every 6 (six) hours as needed for moderate pain. 30 tablet 0   ibuprofen (ADVIL,MOTRIN) 200 MG tablet Take 200 mg by mouth every 6 (six) hours as needed for mild pain, moderate pain or headache.     meloxicam (MOBIC) 15 MG tablet TAKE 1 TABLET BY MOUTH EVERY DAY AS NEEDED FOR PAIN 30 tablet 3   valACYclovir (VALTREX) 1000 MG tablet Take 1,000 mg by mouth daily as needed (Cold sore).     Current Facility-Administered Medications  Medication Dose Route Frequency Provider Last Rate Last Admin   0.9 %  sodium chloride infusion  500 mL Intravenous Once Iva Boop, MD        Allergies as of 10/11/2022   (No Known Allergies)    Family History  Problem Relation Age of Onset   Bladder Cancer Mother    Hypertension Mother    Hyperlipidemia Mother    Colon cancer Father 87   Cancer Father 70       colon cancer    Bipolar disorder Sister    Bipolar disorder Sister    Heart disease Brother 15       Heart Attack   Alcohol abuse Brother    Depression Maternal Uncle    Cancer Paternal Uncle    Colon cancer Maternal Grandmother    Heart disease Maternal Grandmother    Cancer Paternal Grandmother        Breast cancer    Colon polyps Neg Hx    Esophageal cancer Neg Hx    Rectal cancer Neg Hx    Stomach cancer Neg Hx     Social History   Socioeconomic History   Marital status: Legally Separated    Spouse name: Not on file    Number of children: Not on file   Years of education: Not on file   Highest education level: Not on file  Occupational History   Not on file  Tobacco Use   Smoking status: Former    Types: Cigarettes    Quit date: 03/18/2002    Years since quitting: 20.5   Smokeless tobacco: Never  Vaping Use  Vaping Use: Never used  Substance and Sexual Activity   Alcohol use: Yes    Alcohol/week: 4.0 standard drinks of alcohol    Types: 4 Glasses of wine per week    Comment: 4-5 drinks per week per pt   Drug use: No   Sexual activity: Not on file  Other Topics Concern   Not on file  Social History Narrative   Not on file   Social Determinants of Health   Financial Resource Strain: Not on file  Food Insecurity: Not on file  Transportation Needs: Not on file  Physical Activity: Not on file  Stress: Not on file  Social Connections: Not on file  Intimate Partner Violence: Not on file    Review of Systems:  All other review of systems negative except as mentioned in the HPI.  Physical Exam: Vital signs BP 125/75   Pulse 64   Temp 98 F (36.7 C) (Temporal)   Resp 15   Ht 5\' 3"  (1.6 m)   Wt 136 lb (61.7 kg)   LMP 03/04/2015   SpO2 100%   BMI 24.09 kg/m   General:   Alert,  Well-developed, well-nourished, pleasant and cooperative in NAD Lungs:  Clear throughout to auscultation.   Heart:  Regular rate and rhythm; no murmurs, clicks, rubs,  or gallops. Abdomen:  Soft, nontender and nondistended. Normal bowel sounds.   Neuro/Psych:  Alert and cooperative. Normal mood and affect. A and O x 3   @Alan Drummer  Sena Slate, MD, [facility] [phone] (pager) 10/11/2022 1:29 PM@

## 2022-10-11 ENCOUNTER — Encounter: Payer: Self-pay | Admitting: Internal Medicine

## 2022-10-11 ENCOUNTER — Ambulatory Visit (AMBULATORY_SURGERY_CENTER): Payer: BC Managed Care – PPO | Admitting: Internal Medicine

## 2022-10-11 ENCOUNTER — Telehealth: Payer: Self-pay

## 2022-10-11 VITALS — BP 119/68 | HR 72 | Temp 98.0°F | Resp 14 | Ht 63.0 in | Wt 136.0 lb

## 2022-10-11 DIAGNOSIS — Z1211 Encounter for screening for malignant neoplasm of colon: Secondary | ICD-10-CM | POA: Diagnosis not present

## 2022-10-11 DIAGNOSIS — D122 Benign neoplasm of ascending colon: Secondary | ICD-10-CM

## 2022-10-11 DIAGNOSIS — Z09 Encounter for follow-up examination after completed treatment for conditions other than malignant neoplasm: Secondary | ICD-10-CM | POA: Diagnosis present

## 2022-10-11 DIAGNOSIS — D123 Benign neoplasm of transverse colon: Secondary | ICD-10-CM | POA: Diagnosis not present

## 2022-10-11 DIAGNOSIS — D120 Benign neoplasm of cecum: Secondary | ICD-10-CM

## 2022-10-11 DIAGNOSIS — Z86010 Personal history of colonic polyps: Secondary | ICD-10-CM

## 2022-10-11 DIAGNOSIS — D124 Benign neoplasm of descending colon: Secondary | ICD-10-CM

## 2022-10-11 MED ORDER — SODIUM CHLORIDE 0.9 % IV SOLN: 500.0000 mL | Freq: Once | INTRAVENOUS | Status: DC

## 2022-10-11 NOTE — Progress Notes (Signed)
Pt's states no medical or surgical changes since previsit or office visit. 

## 2022-10-11 NOTE — Progress Notes (Signed)
Report to pacu rn. Vss. Care resumed by rn. 

## 2022-10-11 NOTE — Patient Instructions (Addendum)
I removed 9 tiny polyps today.  I will let you know pathology results and when to have another routine colonoscopy by mail and/or My Chart.  Please resume regular medications and diet.  I appreciate the opportunity to care for you. Gatha Mayer, MD, [facility] Handout on polyps and diverticulosis provided   YOU HAD AN ENDOSCOPIC PROCEDURE TODAY AT [facility]:   Refer to the procedure report that was given to you for any specific questions about what was found during the examination.  If the procedure report does not answer your questions, please call your gastroenterologist to clarify.  If you requested that your care partner not be given the details of your procedure findings, then the procedure report has been included in a sealed envelope for you to review at your convenience later.  YOU SHOULD EXPECT: Some feelings of bloating in the abdomen. Passage of more gas than usual.  Walking can help get rid of the air that was put into your GI tract during the procedure and reduce the bloating. If you had a lower endoscopy (such as a colonoscopy or flexible sigmoidoscopy) you may notice spotting of blood in your stool or on the toilet paper. If you underwent a bowel prep for your procedure, you may not have a normal bowel movement for a few days.  Please Note:  You might notice some irritation and congestion in your nose or some drainage.  This is from the oxygen used during your procedure.  There is no need for concern and it should clear up in a day or so.  SYMPTOMS TO REPORT IMMEDIATELY:  Following lower endoscopy (colonoscopy or flexible sigmoidoscopy):  Excessive amounts of blood in the stool  Significant tenderness or worsening of abdominal pains  Swelling of the abdomen that is new, acute  Fever of 100F or higher  For urgent or emergent issues, a gastroenterologist can be reached at any hour by calling [phone]. Do not use MyChart messaging for urgent concerns.     DIET:  We do recommend a small meal at first, but then you may proceed to your regular diet.  Drink plenty of fluids but you should avoid alcoholic beverages for 24 hours.  ACTIVITY:  You should plan to take it easy for the rest of today and you should NOT DRIVE or use heavy machinery until tomorrow (because of the sedation medicines used during the test).    FOLLOW UP: Our staff will call the number listed on your records the next business day following your procedure.  We will call around 7:15- 8:00 am to check on you and address any questions or concerns that you may have regarding the information given to you following your procedure. If we do not reach you, we will leave a message.     If any biopsies were taken you will be contacted by phone or by letter within the next 1-3 weeks.  Please call us at [phone] if you have not heard about the biopsies in 3 weeks.    SIGNATURES/CONFIDENTIALITY: You and/or your care partner have signed paperwork which will be entered into your electronic medical record.  These signatures attest to the fact that that the information above on your After Visit Summary has been reviewed and is understood.  Full responsibility of the confidentiality of this discharge information lies with you and/or your care-partner.

## 2022-10-11 NOTE — Progress Notes (Signed)
Called to room to assist during endoscopic procedure.  Patient ID and intended procedure confirmed with present staff. Received instructions for my participation in the procedure from the performing physician.  

## 2022-10-11 NOTE — Telephone Encounter (Signed)
Error. Entered twice.

## 2022-10-11 NOTE — Telephone Encounter (Signed)
My Chart message from patient:  Dr. Dennard Schaumann, Hello, hope all is well with you.  I need a refill for the cyclobenzaprin for the muscle spasms I am having. I am taking them before I have PT.   I am scheduled to have a CT Thursday and a follow up appointment with the surgeon on Friday, she wants to check to see if the muscle flap has pulled back or retracted.    Thanks, Christina [name]

## 2022-10-11 NOTE — Op Note (Addendum)
[place] Patient Name: Christina [name] Procedure Date: 10/11/2022 1:09 PM MRN: [id number] Endoscopist: Gatha Mayer , MD, [id number] Age: 56 Referring MD:  Date of Birth: April 28, 1966 Gender: Female Account #: [id number] Procedure:                Colonoscopy Indications:              Surveillance: Personal history of adenomatous                            polyps on last colonoscopy > 3 years ago, Last                            colonoscopy: 2019 + FHx CRCA father dx 36 Medicines:                Monitored Anesthesia Care Procedure:                Pre-Anesthesia Assessment:                           - Prior to the procedure, a History and Physical                            was performed, and patient medications and                            allergies were reviewed. The patient's tolerance of                            previous anesthesia was also reviewed. The risks                            and benefits of the procedure and the sedation                            options and risks were discussed with the patient.                            All questions were answered, and informed consent                            was obtained. Prior Anticoagulants: The patient has                            taken no anticoagulant or antiplatelet agents. ASA                            Grade Assessment: II - A patient with mild systemic                            disease. After reviewing the risks and benefits,                            the patient was deemed in satisfactory condition to  undergo the procedure.                           After obtaining informed consent, the colonoscope                            was passed under direct vision. Throughout the                            procedure, the patient's blood pressure, pulse, and                            oxygen saturations were monitored continuously. The                            PCF-HQ190L Colonoscope  was introduced through the                            anus and advanced to the the cecum, identified by                            appendiceal orifice and ileocecal valve. The                            colonoscopy was performed without difficulty. The                            patient tolerated the procedure well. The quality                            of the bowel preparation was good. The ileocecal                            valve, appendiceal orifice, and rectum were                            photographed. The bowel preparation used was                            Miralax via split dose instruction. Scope In: 1:37:02 PM Scope Out: 2:10:07 PM Scope Withdrawal Time: 0 hours 26 minutes 9 seconds  Total Procedure Duration: 0 hours 33 minutes 5 seconds  Findings:                 The perianal and digital rectal examinations were                            normal.                           Eight sessile polyps were found in the descending                            colon, transverse colon, ascending colon and cecum.  The polyps were 3 to 5 mm in size. These polyps                            were removed with a cold snare. Resection and                            retrieval were complete. Verification of patient                            identification for the specimen was done. Estimated                            blood loss was minimal.                           A 1 to 2 mm polyp was found in the transverse                            colon. The polyp was sessile. The polyp was removed                            with a cold biopsy forceps. Resection and retrieval                            were complete. Verification of patient                            identification for the specimen was done. Estimated                            blood loss was minimal.                           Multiple diverticula were found in the sigmoid                            colon.                            The exam was otherwise without abnormality on                            direct and retroflexion views. Complications:            No immediate complications. Estimated Blood Loss:     Estimated blood loss was minimal. Impression:               - Eight 3 to 5 mm polyps in the descending colon,                            in the transverse colon, in the ascending colon and                            in the cecum, removed with a cold snare. Resected  and retrieved.                           - One 1 to 2 mm polyp in the transverse colon,                            removed with a cold biopsy forceps. Resected and                            retrieved.                           - Diverticulosis in the sigmoid colon.                           - The examination was otherwise normal on direct                            and retroflexion views.                           - Personal history of colonic polyps.                           02/2015 - 20 mm TA, small TA and small ssp no                            dysplasia                           04/03/2018 diminutive right colon polyps x 2 adenomas                           Family Hx CRCA father dx 31 Recommendation:           - Patient has a contact number available for                            emergencies. The signs and symptoms of potential                            delayed complications were discussed with the                            patient. Return to normal activities tomorrow.                            Written discharge instructions were provided to the                            patient.                           - Resume previous diet.                           - Continue  present medications.                           - Repeat colonoscopy is recommended for                            surveillance. The colonoscopy date will be                            determined after pathology results from  today's                            exam become available for review. Gatha Mayer, MD 10/11/2022 2:20:39 PM This report has been signed electronically.

## 2022-10-12 ENCOUNTER — Other Ambulatory Visit: Payer: Self-pay | Admitting: Family Medicine

## 2022-10-12 ENCOUNTER — Telehealth: Payer: Self-pay | Admitting: *Deleted

## 2022-10-12 DIAGNOSIS — R918 Other nonspecific abnormal finding of lung field: Secondary | ICD-10-CM | POA: Diagnosis not present

## 2022-10-12 DIAGNOSIS — L7682 Other postprocedural complications of skin and subcutaneous tissue: Secondary | ICD-10-CM | POA: Diagnosis not present

## 2022-10-12 DIAGNOSIS — J9809 Other diseases of bronchus, not elsewhere classified: Secondary | ICD-10-CM | POA: Diagnosis not present

## 2022-10-12 MED ORDER — CYCLOBENZAPRINE HCL 5 MG PO TABS: 5.0000 mg | ORAL_TABLET | Freq: Three times a day (TID) | ORAL | 0 refills | Status: DC | PRN

## 2022-10-12 NOTE — Telephone Encounter (Signed)
No answer for post procedure follow up call . Unable to leave VM.

## 2022-10-13 DIAGNOSIS — R918 Other nonspecific abnormal finding of lung field: Secondary | ICD-10-CM | POA: Diagnosis not present

## 2022-10-16 ENCOUNTER — Encounter: Payer: Self-pay | Admitting: Family Medicine

## 2022-10-16 ENCOUNTER — Ambulatory Visit: Payer: BC Managed Care – PPO | Admitting: Family Medicine

## 2022-10-16 DIAGNOSIS — M25511 Pain in right shoulder: Secondary | ICD-10-CM | POA: Diagnosis not present

## 2022-10-16 DIAGNOSIS — R293 Abnormal posture: Secondary | ICD-10-CM | POA: Diagnosis not present

## 2022-10-16 DIAGNOSIS — M25611 Stiffness of right shoulder, not elsewhere classified: Secondary | ICD-10-CM | POA: Diagnosis not present

## 2022-10-16 DIAGNOSIS — M62511 Muscle wasting and atrophy, not elsewhere classified, right shoulder: Secondary | ICD-10-CM | POA: Diagnosis not present

## 2022-10-17 ENCOUNTER — Encounter: Payer: Self-pay | Admitting: Internal Medicine

## 2022-10-17 DIAGNOSIS — Z86010 Personal history of colonic polyps: Secondary | ICD-10-CM

## 2022-11-06 ENCOUNTER — Telehealth: Payer: Self-pay

## 2022-11-06 NOTE — Telephone Encounter (Signed)
Pt came in to have ppw filled out by pcp. Pt filled out an intake form. Intake form and ppw in nurse's folder. Please call pt when ppw has been faxed and available to pick up. Please advise.  Cb#: [phone]

## 2022-11-13 NOTE — Telephone Encounter (Signed)
Patient called to follow up on status of paperwork; stated it needs to be faxed by 1/18 for her to be able to return to work.   Please advise at [phone].

## 2022-11-14 ENCOUNTER — Telehealth: Payer: Self-pay

## 2022-11-14 ENCOUNTER — Encounter: Payer: Self-pay | Admitting: Family Medicine

## 2022-11-14 NOTE — Telephone Encounter (Signed)
Pt called regarding leave extension. Advised she will need an appointment to discuss and have forms filled out. Pt states she was requesting the extension due to continuing physical therapy. Pt  states that if you want to release her back to work, she is okay with that, she will just need the form to have accommodations to not lift more than 25#. Pt to bring new form by office to release back to work. Thanks.

## 2022-11-16 ENCOUNTER — Ambulatory Visit: Payer: BC Managed Care – PPO | Admitting: Family Medicine

## 2023-01-12 ENCOUNTER — Encounter: Payer: Self-pay | Admitting: Family Medicine

## 2023-01-12 ENCOUNTER — Ambulatory Visit (INDEPENDENT_AMBULATORY_CARE_PROVIDER_SITE_OTHER): Payer: BC Managed Care – PPO | Admitting: Family Medicine

## 2023-01-12 ENCOUNTER — Ambulatory Visit: Payer: BC Managed Care – PPO | Admitting: Family Medicine

## 2023-01-12 VITALS — BP 112/62 | HR 53 | Temp 97.5°F | Ht 63.0 in | Wt 138.4 lb

## 2023-01-12 DIAGNOSIS — R300 Dysuria: Secondary | ICD-10-CM

## 2023-01-12 LAB — URINALYSIS, ROUTINE W REFLEX MICROSCOPIC
Bilirubin Urine: NEGATIVE
Hyaline Cast: NONE SEEN /LPF
Ketones, ur: NEGATIVE
Nitrite: POSITIVE — AB
Protein, ur: NEGATIVE
Specific Gravity, Urine: 1.003 (ref 1.001–1.035)
pH: 5.5 (ref 5.0–8.0)

## 2023-01-12 MED ORDER — CEPHALEXIN 500 MG PO CAPS: 500.0000 mg | ORAL_CAPSULE | Freq: Three times a day (TID) | ORAL | 0 refills | Status: DC

## 2023-01-12 MED ORDER — ESCITALOPRAM OXALATE 10 MG PO TABS: 10.0000 mg | ORAL_TABLET | Freq: Every day | ORAL | 3 refills | Status: DC

## 2023-01-12 NOTE — Progress Notes (Signed)
Subjective:    Patient ID: Christina [name], female    DOB: [birth year], 57 y.o.   MRN: [id number]  Dysuria   Patient is a very pleasant 57 year old Caucasian female who presents today complaining of dysuria and increased urinary frequency and urgency for the last 2 days.  She also has been battling depression.  She is currently on Lexapro 5 mg a day.  She denies any adverse side effects from the Lexapro such as nausea vomiting or headaches. Past Medical History:  Diagnosis Date   Allergy    Anxiety    Cancer ([place])    Depression    Hx of adenomatous and sessile serrated colonic polyps 03/19/2015   Hyperlipidemia    Mass of upper lobe of right lung    biopsy showed pulmoary aspergillosis   Pulmonary aspergillosis [facility])    Past Surgical History:  Procedure Laterality Date   BRONCHIAL BIOPSY  02/27/2022   Procedure: BRONCHIAL BIOPSIES;  Surgeon: Collene Gobble, MD;  Location: [place];  Service: Pulmonary;;   BRONCHIAL BRUSHINGS  02/27/2022   Procedure: BRONCHIAL BRUSHINGS;  Surgeon: Collene Gobble, MD;  Location: [place];  Service: Pulmonary;;   BRONCHIAL NEEDLE ASPIRATION BIOPSY  02/27/2022   Procedure: BRONCHIAL NEEDLE ASPIRATION BIOPSIES;  Surgeon: Collene Gobble, MD;  Location: [place];  Service: Pulmonary;;   CESAREAN SECTION     CESAREAN SECTION N/A    Phreesia 07/19/2020   COLONOSCOPY  2016   ENDOBRONCHIAL ULTRASOUND  02/27/2022   Procedure: ENDOBRONCHIAL ULTRASOUND;  Surgeon: Collene Gobble, MD;  [facility];  Service: Pulmonary;;   KNEE ARTHROSCOPY     right knee   THORACOTOMY/LOBECTOMY     right upper-pulmonary aspergillosis   VIDEO BRONCHOSCOPY WITH RADIAL ENDOBRONCHIAL ULTRASOUND  02/27/2022   Procedure: VIDEO BRONCHOSCOPY WITH RADIAL ENDOBRONCHIAL ULTRASOUND;  Surgeon: Collene Gobble, MD;  [facility] ENDOS[facility];  Service: Pulmonary;;   Current Outpatient Medications on File Prior to Visit  Medication Sig Dispense Refill   ALPRAZolam  (XANAX) 0.25 MG tablet Take 1 tablet (0.25 mg total) by mouth See admin instructions. Take 0.25 mg if wake up in the middle of the night if needed 30 tablet 3   B Complex Vitamins (B COMPLEX 50 PO) Take 1 tablet by mouth daily.     cholecalciferol (VITAMIN D) 1000 units tablet Take 1,000 Units by mouth daily.     cyclobenzaprine (FLEXERIL) 5 MG tablet Take 1 tablet (5 mg total) by mouth 3 (three) times daily as needed for muscle spasms. 30 tablet 0   diphenhydrAMINE (BENADRYL) 25 MG tablet Take 25 mg by mouth at bedtime.     docusate sodium (COLACE) 100 MG capsule Take 100 mg by mouth 2 (two) times daily.     escitalopram (LEXAPRO) 5 MG tablet TAKE 1 TABLET BY MOUTH EVERYDAY AT BEDTIME 90 tablet 3   HYDROcodone-acetaminophen (NORCO) 5-325 MG tablet Take 1 tablet by mouth every 6 (six) hours as needed for moderate pain. 30 tablet 0   ibuprofen (ADVIL,MOTRIN) 200 MG tablet Take 200 mg by mouth every 6 (six) hours as needed for mild pain, moderate pain or headache.     meloxicam (MOBIC) 15 MG tablet TAKE 1 TABLET BY MOUTH EVERY DAY AS NEEDED FOR PAIN 30 tablet 3   valACYclovir (VALTREX) 1000 MG tablet Take 1,000 mg by mouth daily as needed (Cold sore).     zolpidem (AMBIEN) 10 MG tablet Take 1 tablet (10 mg total) by mouth at bedtime as needed  for sleep. 15 tablet 1   No current facility-administered medications on file prior to visit.   No Known Allergies Social History   Socioeconomic History   Marital status: Legally Separated    Spouse name: Not on file   Number of children: Not on file   Years of education: Not on file   Highest education level: Not on file  Occupational History   Not on file  Tobacco Use   Smoking status: Former    Types: Cigarettes    Quit date: 03/18/2002    Years since quitting: 20.8   Smokeless tobacco: Never  Vaping Use   Vaping Use: Never used  Substance and Sexual Activity   Alcohol use: Yes    Alcohol/week: 4.0 standard drinks of alcohol    Types: 4  Glasses of wine per week    Comment: 4-5 drinks per week per pt   Drug use: No   Sexual activity: Not on file  Other Topics Concern   Not on file  Social History Narrative   Not on file   Social Determinants of Health   Financial Resource Strain: Not on file  Food Insecurity: Not on file  Transportation Needs: Not on file  Physical Activity: Not on file  Stress: Not on file  Social Connections: Not on file  Intimate Partner Violence: Not on file   Family History  Problem Relation Age of Onset   Bladder Cancer Mother    Hypertension Mother    Hyperlipidemia Mother    Colon cancer Father 74   Cancer Father 61       colon cancer    Bipolar disorder Sister    Bipolar disorder Sister    Heart disease Brother 19       Heart Attack   Alcohol abuse Brother    Depression Maternal Uncle    Cancer Paternal Uncle    Colon cancer Maternal Grandmother    Heart disease Maternal Grandmother    Cancer Paternal Grandmother        Breast cancer    Colon polyps Neg Hx    Esophageal cancer Neg Hx    Rectal cancer Neg Hx    Stomach cancer Neg Hx      Review of Systems  Genitourinary:  Positive for dysuria.  All other systems reviewed and are negative.      Objective:   Physical Exam Constitutional:      General: She is not in acute distress.    Appearance: Normal appearance. She is normal weight. She is not ill-appearing.  HENT:     Right Ear: Tympanic membrane, ear canal and external ear normal. There is no impacted cerumen.     Left Ear: Tympanic membrane, ear canal and external ear normal. There is no impacted cerumen.     Nose: Nose normal. No congestion or rhinorrhea.     Mouth/Throat:     Mouth: Mucous membranes are moist.     Pharynx: Oropharynx is clear. No oropharyngeal exudate or posterior oropharyngeal erythema.  Eyes:     General:        Right eye: No discharge.        Left eye: No discharge.     Extraocular Movements: Extraocular movements intact.      Conjunctiva/sclera: Conjunctivae normal.     Pupils: Pupils are equal, round, and reactive to light.  Neck:     Vascular: No carotid bruit.  Cardiovascular:     Rate and Rhythm: Normal rate and regular  rhythm.     Pulses: Normal pulses.     Heart sounds: Normal heart sounds. No murmur heard.    No friction rub. No gallop.  Pulmonary:     Effort: Pulmonary effort is normal. No respiratory distress.     Breath sounds: Normal breath sounds. No stridor. No wheezing, rhonchi or rales.    Abdominal:     General: Bowel sounds are normal. There is no distension.     Palpations: Abdomen is soft.     Tenderness: There is no abdominal tenderness. There is no guarding or rebound.  Musculoskeletal:        General: Normal range of motion.     Cervical back: Normal range of motion and neck supple. No rigidity or tenderness.     Right lower leg: No edema.     Left lower leg: No edema.  Lymphadenopathy:     Cervical: No cervical adenopathy.  Skin:    General: Skin is warm.     Coloration: Skin is not jaundiced or pale.     Findings: No bruising, erythema, lesion or rash.  Neurological:     General: No focal deficit present.     Mental Status: She is alert and oriented to person, place, and time. Mental status is at baseline.     Cranial Nerves: No cranial nerve deficit.     Sensory: No sensory deficit.     Motor: No weakness.     Coordination: Coordination normal.     Gait: Gait normal.     Deep Tendon Reflexes: Reflexes normal.  Psychiatric:        Mood and Affect: Mood normal.        Behavior: Behavior normal.        Thought Content: Thought content normal.        Judgment: Judgment normal.    Thoracotomy scar      Assessment & Plan:   Dysuria - Plan: Urinalysis, Routine w reflex microscopic Urinalysis shows trace leukocyte esterase, positive nitrates, and trace blood.  Recommended Keflex 500 mg 3 times a day for 5 days.  Recommended we try increasing Lexapro to 10 mg a day to see  if this will help with her depression.

## 2023-02-03 ENCOUNTER — Other Ambulatory Visit: Payer: Self-pay | Admitting: Family Medicine

## 2023-02-03 DIAGNOSIS — R300 Dysuria: Secondary | ICD-10-CM

## 2023-02-05 NOTE — Telephone Encounter (Signed)
Unable to refill per protocol, Rx request is too soon. Last refill 01/12/23 for 30 and 3 refills.  Requested Prescriptions  Pending Prescriptions Disp Refills   escitalopram (LEXAPRO) 10 MG tablet [Pharmacy Med Name: ESCITALOPRAM 10 MG TABLET] 90 tablet 2    Sig: TAKE 1 TABLET BY MOUTH EVERY DAY     Psychiatry:  Antidepressants - SSRI Failed - 02/03/2023  8:31 AM      Failed - Valid encounter within last 6 months    Recent Outpatient Visits           1 year ago Other acute sinusitis, recurrence not specified   [facility] [place], Priscille Heidelberg, MD   1 year ago Routine general medical examination at a health care facility   [facility] [name], MD   2 years ago Acute cystitis without hematuria   [facility] Medicine Valentino Nose, NP   2 years ago Exposure to confirmed case of COVID-19   [facility] Medicine [place], Velna Hatchet, MD   2 years ago Other acute sinusitis, recurrence not specified   [facility] Medicine [place], Velna Hatchet, MD              Passed - Completed PHQ-2 or PHQ-9 in the last 360 days

## 2023-02-22 ENCOUNTER — Other Ambulatory Visit: Payer: Self-pay | Admitting: Family Medicine

## 2023-02-22 NOTE — Telephone Encounter (Signed)
Requested medication (s) are due for refill today - yes  Requested medication (s) are on the active medication list yes  Future visit scheduled -no  Last refill: 05/11/22 #30 3RF  Notes to clinic: non delegated Rx  Requested Prescriptions  Pending Prescriptions Disp Refills   ALPRAZolam (XANAX) 0.25 MG tablet [Pharmacy Med Name: ALPRAZOLAM 0.25 MG TABLET] 30 tablet     Sig: TAKE 1 TABLET SEE ADMIN INSTRUCTIONS. TAKE 0.25 MG IF WAKE UP IN THE MIDDLE OF THE NIGHT IF NEEDED     Not Delegated - Psychiatry: Anxiolytics/Hypnotics 2 Failed - 02/22/2023  7:28 AM      Failed - This refill cannot be delegated      Failed - Urine Drug Screen completed in last 360 days      Failed - Valid encounter within last 6 months    Recent Outpatient Visits           1 year ago Other acute sinusitis, recurrence not specified   [facility] Medicine [place], Priscille Heidelberg, MD   1 year ago Routine general medical examination at a health care facility   [facility] Medicine [name], MD   2 years ago Acute cystitis without hematuria   [facility] Medicine Valentino Nose, NP   2 years ago Exposure to confirmed case of COVID-19   [facility] Medicine [place], Velna Hatchet, MD   2 years ago Other acute sinusitis, recurrence not specified   [facility] Medicine [place], Velna Hatchet, MD              Passed - Patient is not pregnant         Requested Prescriptions  Pending Prescriptions Disp Refills   ALPRAZolam (XANAX) 0.25 MG tablet [Pharmacy Med Name: ALPRAZOLAM 0.25 MG TABLET] 30 tablet     Sig: TAKE 1 TABLET SEE ADMIN INSTRUCTIONS. TAKE 0.25 MG IF WAKE UP IN THE MIDDLE OF THE NIGHT IF NEEDED     Not Delegated - Psychiatry: Anxiolytics/Hypnotics 2 Failed - 02/22/2023  7:28 AM      Failed - This refill cannot be delegated      Failed - Urine Drug Screen completed in last 360 days      Failed - Valid encounter within last 6 months    Recent  Outpatient Visits           1 year ago Other acute sinusitis, recurrence not specified   [facility] Medicine [name], MD   1 year ago Routine general medical examination at a health care facility   [facility] Medicine [name], MD   2 years ago Acute cystitis without hematuria   [facility] Medicine Valentino Nose, NP   2 years ago Exposure to confirmed case of COVID-19   [facility] Medicine [place], Velna Hatchet, MD   2 years ago Other acute sinusitis, recurrence not specified   [facility] Medicine [place], Velna Hatchet, MD              Passed - Patient is not pregnant

## 2023-03-06 DIAGNOSIS — Z860101 Personal history of adenomatous and serrated colon polyps: Secondary | ICD-10-CM | POA: Insufficient documentation

## 2023-03-14 DIAGNOSIS — B977 Papillomavirus as the cause of diseases classified elsewhere: Secondary | ICD-10-CM | POA: Insufficient documentation

## 2023-05-11 ENCOUNTER — Other Ambulatory Visit: Payer: Self-pay | Admitting: Family Medicine

## 2023-05-11 DIAGNOSIS — R300 Dysuria: Secondary | ICD-10-CM

## 2023-05-11 NOTE — Telephone Encounter (Signed)
Requested Prescriptions  Pending Prescriptions Disp Refills   escitalopram (LEXAPRO) 10 MG tablet [Pharmacy Med Name: ESCITALOPRAM 10 MG TABLET] 30 tablet 3    Sig: TAKE 1 TABLET BY MOUTH EVERY DAY     Psychiatry:  Antidepressants - SSRI Failed - 05/11/2023  2:17 AM      Failed - Valid encounter within last 6 months    Recent Outpatient Visits           1 year ago Other acute sinusitis, recurrence not specified   [facility] Medicine [place], Priscille Heidelberg, MD   1 year ago Routine general medical examination at a health care facility   [facility] Medicine [name], MD   2 years ago Acute cystitis without hematuria   [facility] Medicine Valentino Nose, NP   2 years ago Exposure to confirmed case of COVID-19   [facility] Medicine [place], Velna Hatchet, MD   2 years ago Other acute sinusitis, recurrence not specified   [facility] Medicine [place], Velna Hatchet, MD              Passed - Completed PHQ-2 or PHQ-9 in the last 360 days

## 2023-05-15 ENCOUNTER — Other Ambulatory Visit: Payer: Self-pay | Admitting: Family Medicine

## 2023-05-15 DIAGNOSIS — Z1231 Encounter for screening mammogram for malignant neoplasm of breast: Secondary | ICD-10-CM

## 2023-06-05 ENCOUNTER — Ambulatory Visit
Admission: RE | Admit: 2023-06-05 | Discharge: 2023-06-05 | Disposition: A | Discharge status: Home / Self Care | Payer: BC Managed Care – PPO | Source: Ambulatory Visit | Attending: Family Medicine | Admitting: Family Medicine

## 2023-06-05 DIAGNOSIS — Z1231 Encounter for screening mammogram for malignant neoplasm of breast: Secondary | ICD-10-CM

## 2023-07-03 ENCOUNTER — Encounter: Payer: Self-pay | Admitting: Family Medicine

## 2023-07-03 ENCOUNTER — Ambulatory Visit: Payer: BC Managed Care – PPO | Admitting: Family Medicine

## 2023-07-03 VITALS — BP 102/80 | HR 58 | Temp 98.1°F | Ht 63.0 in | Wt 144.0 lb

## 2023-07-03 DIAGNOSIS — B9689 Other specified bacterial agents as the cause of diseases classified elsewhere: Secondary | ICD-10-CM

## 2023-07-03 DIAGNOSIS — N898 Other specified noninflammatory disorders of vagina: Secondary | ICD-10-CM

## 2023-07-03 DIAGNOSIS — N760 Acute vaginitis: Secondary | ICD-10-CM | POA: Insufficient documentation

## 2023-07-03 LAB — WET PREP FOR TRICH, YEAST, CLUE

## 2023-07-03 MED ORDER — METRONIDAZOLE 500 MG PO TABS: 500.0000 mg | ORAL_TABLET | Freq: Two times a day (BID) | ORAL | 0 refills | Status: AC

## 2023-07-03 NOTE — Addendum Note (Signed)
Addended by: Arta Silence on: 07/03/2023 11:54 AM   Modules accepted: Orders

## 2023-07-03 NOTE — Assessment & Plan Note (Signed)
Wet prep positive for clue cells and bacteria along with thin, yellow vaginal discharge and odor. Will treat with Flagyl 500mg  BID for 7 days. Return to office if symptoms persist or worsen.

## 2023-07-03 NOTE — Progress Notes (Signed)
Subjective:  HPI: Christina [name] is a 57 y.o. female presenting on 07/03/2023 for Acute Visit (vaginal discharge with odor - JBG\\\)   HPI Patient is in today for yellow vaginal discharge and odor that is intermittent for several months. She denies dysuria, urinary frequency or urgency, or vaginal itching. No new sexual partners or high risk sexual behaviors.  Review of Systems  All other systems reviewed and are negative.   Relevant past medical history reviewed and updated as indicated.   Past Medical History:  Diagnosis Date   Allergy    Anxiety    Cancer ([facility])    Depression    Hx of adenomatous and sessile serrated colonic polyps 03/19/2015   Hyperlipidemia    Mass of upper lobe of right lung    biopsy showed pulmoary aspergillosis   Pulmonary aspergillosis [facility])      Past Surgical History:  Procedure Laterality Date   BRONCHIAL BIOPSY  02/27/2022   Procedure: BRONCHIAL BIOPSIES;  Surgeon: Leslye Peer, MD;  [facility] END[facility]COPY;  Service: Pulmonary;;   BRONCHIAL BRUSHINGS  02/27/2022   Procedure: BRONCHIAL BRUSHINGS;  Surgeon: Leslye Peer, MD;  Location: [facility] ENDOSCOPY;  Service: Pulmonary;;   BRONCHIAL NEEDLE ASPIRATION BIOPSY  02/27/2022   Procedure: BRONCHIAL NEEDLE ASPIRATION BIOPSIES;  Surgeon: Leslye Peer, MD;  Location: [facility] ENDOSCOPY;  Service: Pulmonary;;   CESAREAN SECTION     CESAREAN SECTION N/A    Phreesia 07/19/2020   COLONOSCOPY  2016   ENDOBRONCHIAL ULTRASOUND  02/27/2022   Procedure: ENDOBRONCHIAL ULTRASOUND;  Surgeon: Leslye Peer, MD;  Location[facility]OS[facility];  Service: Pulmonary;;   KNEE ARTHROSCOPY     right knee   THORACOTOMY/LOBECTOMY     right upper-pulmonary aspergillosis   VIDEO BRONCHOSCOPY WITH RADIAL ENDOBRONCHIAL ULTRASOUND  02/27/2022   Procedure: VIDEO BRONCHOSCOPY WITH RADIAL ENDOBRONCHIAL ULTRASOUND;  Surgeon: Leslye Peer, MD;  Location[facility]OS[facility];  Service: Pulmonary;;    Allergies and medications reviewed and  updated.   Current Outpatient Medications:    ALPRAZolam (XANAX) 0.25 MG tablet, TAKE 1 TABLET SEE ADMIN INSTRUCTIONS. TAKE 0.25 MG IF WAKE UP IN THE MIDDLE OF THE NIGHT IF NEEDED, Disp: 30 tablet, Rfl: 1   B Complex Vitamins (B COMPLEX 50 PO), Take 1 tablet by mouth daily., Disp: , Rfl:    cholecalciferol (VITAMIN D) 1000 units tablet, Take 1,000 Units by mouth daily., Disp: , Rfl:    diphenhydrAMINE (BENADRYL) 25 MG tablet, Take 25 mg by mouth at bedtime., Disp: , Rfl:    escitalopram (LEXAPRO) 10 MG tablet, TAKE 1 TABLET BY MOUTH EVERY DAY, Disp: 30 tablet, Rfl: 3   ibuprofen (ADVIL,MOTRIN) 200 MG tablet, Take 200 mg by mouth every 6 (six) hours as needed for mild pain, moderate pain or headache., Disp: , Rfl:    meloxicam (MOBIC) 15 MG tablet, TAKE 1 TABLET BY MOUTH EVERY DAY AS NEEDED FOR PAIN, Disp: 30 tablet, Rfl: 3   metroNIDAZOLE (FLAGYL) 500 MG tablet, Take 1 tablet (500 mg total) by mouth 2 (two) times daily for 7 days., Disp: 14 tablet, Rfl: 0   valACYclovir (VALTREX) 1000 MG tablet, Take 1,000 mg by mouth daily as needed (Cold sore)., Disp: , Rfl:    cephALEXin (KEFLEX) 500 MG capsule, Take 1 capsule (500 mg total) by mouth 3 (three) times daily. (Patient not taking: Reported on 07/03/2023), Disp: 15 capsule, Rfl: 0   loteprednol (ALREX) 0.2 % SUSP, Place 2 drops into the right eye 2 (two) times  daily., Disp: , Rfl:   No Known Allergies  Objective:   BP 102/80   Pulse (!) 58   Temp 98.1 F (36.7 C) (Oral)   Ht 5\' 3"  (1.6 m)   Wt 144 lb (65.3 kg)   LMP 03/04/2015   SpO2 99%   BMI 25.51 kg/m      07/03/2023   11:11 AM 01/12/2023   11:03 AM 10/11/2022    2:32 PM  Vitals with BMI  Height 5\' 3"  5\' 3"    Weight 144 lbs 138 lbs 6 oz   BMI 25.51 24.52   Systolic 102 112 295  Diastolic 80 62 68  Pulse 58 53 72     Physical Exam Vitals and nursing note reviewed. Exam conducted with a chaperone present.  Constitutional:      Appearance: Normal appearance. She is normal  weight.  HENT:     Head: Normocephalic and atraumatic.  Genitourinary:    General: Normal vulva.     Vagina: Normal. No vaginal discharge.  Skin:    General: Skin is warm and dry.  Neurological:     General: No focal deficit present.     Mental Status: She is alert and oriented to person, place, and time. Mental status is at baseline.  Psychiatric:        Mood and Affect: Mood normal.        Behavior: Behavior normal.        Thought Content: Thought content normal.        Judgment: Judgment normal.     Assessment & Plan:  Bacterial vaginosis Assessment & Plan: Wet prep positive for clue cells and bacteria along with thin, yellow vaginal discharge and odor. Will treat with Flagyl 500mg  BID for 7 days. Return to office if symptoms persist or worsen.   Vaginal discharge  Other orders -     metroNIDAZOLE; Take 1 tablet (500 mg total) by mouth 2 (two) times daily for 7 days.  Dispense: 14 tablet; Refill: 0     Follow up plan: Return if symptoms worsen or fail to improve.  Park Meo, FNP

## 2023-08-29 ENCOUNTER — Encounter: Payer: Self-pay | Admitting: Family Medicine

## 2023-08-30 ENCOUNTER — Other Ambulatory Visit: Payer: Self-pay | Admitting: Family Medicine

## 2023-08-30 MED ORDER — ALPRAZOLAM 0.25 MG PO TABS: 0.2500 mg | ORAL_TABLET | Freq: Every evening | ORAL | 1 refills | Status: DC | PRN

## 2023-09-10 ENCOUNTER — Other Ambulatory Visit: Payer: Self-pay

## 2023-09-10 DIAGNOSIS — R300 Dysuria: Secondary | ICD-10-CM

## 2023-09-10 NOTE — Telephone Encounter (Signed)
Prescription Request  09/10/2023  LOV: 07/03/23  What is the name of the medication or equipment? escitalopram (LEXAPRO) 10 MG tablet [[id number]]  Have you contacted your pharmacy to request a refill? Yes   Which pharmacy would you like this sent to?  [id number] - [place], [place] - [address] [place] [address] Phone: [phone] Fax: [phone]    Patient notified that their request is being sent to the clinical staff for review and that they should receive a response within 2 business days.   Please advise at [facility] [phone]

## 2023-09-11 MED ORDER — ESCITALOPRAM OXALATE 10 MG PO TABS
10.0000 mg | ORAL_TABLET | Freq: Every day | ORAL | 3 refills | Status: DC
Start: 2023-09-11 — End: 2024-01-08

## 2023-09-11 NOTE — Telephone Encounter (Signed)
LOV 07/03/2023   Requested Prescriptions  Pending Prescriptions Disp Refills   escitalopram (LEXAPRO) 10 MG tablet 30 tablet 3    Sig: Take 1 tablet (10 mg total) by mouth daily.     Psychiatry:  Antidepressants - SSRI Failed - 09/10/2023 11:05 AM      Failed - Valid encounter within last 6 months    Recent Outpatient Visits           1 year ago Other acute sinusitis, recurrence not specified   [facility] Medicine [place], Priscille Heidelberg, MD   1 year ago Routine general medical examination at a health care facility   [facility] Medicine [name], MD   2 years ago Acute cystitis without hematuria   [facility] Medicine Valentino Nose, NP   2 years ago Exposure to confirmed case of COVID-19   [facility] Medicine [place], Velna Hatchet, MD   3 years ago Other acute sinusitis, recurrence not specified   [facility] Medicine [place], Velna Hatchet, MD              Passed - Completed PHQ-2 or PHQ-9 in the last 360 days

## 2023-12-27 ENCOUNTER — Encounter: Payer: BC Managed Care – PPO | Admitting: Family Medicine

## 2024-01-06 ENCOUNTER — Other Ambulatory Visit: Payer: Self-pay | Admitting: Family Medicine

## 2024-01-06 DIAGNOSIS — R300 Dysuria: Secondary | ICD-10-CM

## 2024-01-08 NOTE — Telephone Encounter (Signed)
 Requested medications are due for refill today.  yes  Requested medications are on the active medications list.  yes  Last refill. 09/11/2023 #30 3 rf  Future visit scheduled.   no  Notes to clinic.  Pt is overdue for a cpe.    Requested Prescriptions  Pending Prescriptions Disp Refills   escitalopram (LEXAPRO) 10 MG tablet [Pharmacy Med Name: ESCITALOPRAM 10 MG TABLET] 30 tablet 3    Sig: TAKE 1 TABLET BY MOUTH EVERY DAY     Psychiatry:  Antidepressants - SSRI Failed - 01/08/2024  7:36 AM      Failed - Completed PHQ-2 or PHQ-9 in the last 360 days      Failed - Valid encounter within last 6 months    Recent Outpatient Visits           2 years ago Other acute sinusitis, recurrence not specified   [facility] Medicine [place], Priscille Heidelberg, MD   2 years ago Routine general medical examination at a health care facility   [facility] Medicine [name], MD   3 years ago Acute cystitis without hematuria   [facility] Medicine Valentino Nose, NP   3 years ago Exposure to confirmed case of COVID-19   [facility] Medicine [place], Velna Hatchet, MD   3 years ago Other acute sinusitis, recurrence not specified   [facility] Medicine [place], Velna Hatchet, MD

## 2024-01-22 ENCOUNTER — Encounter: Admitting: Family Medicine

## 2024-01-23 ENCOUNTER — Encounter: Payer: Self-pay | Admitting: Family Medicine

## 2024-01-23 ENCOUNTER — Ambulatory Visit: Admitting: Family Medicine

## 2024-01-23 VITALS — BP 120/64 | HR 66 | Temp 97.9°F | Ht 63.0 in | Wt 145.0 lb

## 2024-01-23 DIAGNOSIS — Z0001 Encounter for general adult medical examination with abnormal findings: Secondary | ICD-10-CM | POA: Diagnosis not present

## 2024-01-23 DIAGNOSIS — Z8639 Personal history of other endocrine, nutritional and metabolic disease: Secondary | ICD-10-CM | POA: Diagnosis not present

## 2024-01-23 DIAGNOSIS — Z0000 Encounter for general adult medical examination without abnormal findings: Secondary | ICD-10-CM

## 2024-01-23 DIAGNOSIS — Z124 Encounter for screening for malignant neoplasm of cervix: Secondary | ICD-10-CM

## 2024-01-23 DIAGNOSIS — Z1329 Encounter for screening for other suspected endocrine disorder: Secondary | ICD-10-CM | POA: Diagnosis not present

## 2024-01-23 DIAGNOSIS — B001 Herpesviral vesicular dermatitis: Secondary | ICD-10-CM

## 2024-01-23 MED ORDER — VALACYCLOVIR HCL 500 MG PO TABS: 500.0000 mg | ORAL_TABLET | Freq: Every day | ORAL | 1 refills | Status: DC

## 2024-01-23 NOTE — Progress Notes (Signed)
 Subjective:   Christina [name] is a 58 y.o. female for annual routine Pap and checkup. Concerns include recurrent cold sores. These occur approximately once monthly. She is currently taking 1000 mg BID x1 day for occurrences. Would like to try suppressive therapy. Her mammogram is up to date and not due again until August. Colonoscopy due next year. Vaccines UTD including flu and covid.  Current Outpatient Medications  Medication Sig Dispense Refill   ALPRAZolam (XANAX) 0.25 MG tablet Take 1 tablet (0.25 mg total) by mouth at bedtime as needed for anxiety. 30 tablet 1   B Complex Vitamins (B COMPLEX 50 PO) Take 1 tablet by mouth daily.     cholecalciferol (VITAMIN D) 1000 units tablet Take 1,000 Units by mouth daily.     diphenhydrAMINE (BENADRYL) 25 MG tablet Take 25 mg by mouth at bedtime.     escitalopram (LEXAPRO) 10 MG tablet TAKE 1 TABLET BY MOUTH EVERY DAY 30 tablet 3   ibuprofen (ADVIL,MOTRIN) 200 MG tablet Take 200 mg by mouth every 6 (six) hours as needed for mild pain, moderate pain or headache.     loteprednol (ALREX) 0.2 % SUSP Place 2 drops into the right eye 2 (two) times daily.     meloxicam (MOBIC) 15 MG tablet TAKE 1 TABLET BY MOUTH EVERY DAY AS NEEDED FOR PAIN 30 tablet 3   valACYclovir (VALTREX) 500 MG tablet Take 1 tablet (500 mg total) by mouth daily. for suppressive therapy 90 tablet 1   No current facility-administered medications for this visit.   Allergies: Patient has no known allergies.  Patient's last menstrual period was 03/04/2015. Past Medical History:  Diagnosis Date   Allergy    Anxiety    Cancer (HCC)    Depression    Hx of adenomatous and sessile serrated colonic polyps 03/19/2015   Hyperlipidemia    Mass of upper lobe of right lung    biopsy showed pulmoary aspergillosis   Pulmonary aspergillosis [facility])    Past Surgical History:  Procedure Laterality Date   BRONCHIAL BIOPSY  02/27/2022   Procedure: BRONCHIAL BIOPSIES;  Surgeon: Leslye Peer,  MD;  Location: [facility] ENDOSCOPY;  Service: Pulmonary;;   BRONCHIAL BRUSHINGS  02/27/2022   Procedure: BRONCHIAL BRUSHINGS;  Surgeon: Leslye Peer, MD;  Location: [facility] ENDOSCOPY;  Service: Pulmonary;;   BRONCHIAL NEEDLE ASPIRATION BIOPSY  02/27/2022   Procedure: BRONCHIAL NEEDLE ASPIRATION BIOPSIES;  Surgeon: Leslye Peer, MD;  Location: [facility] ENDOSCOPY;  Service: Pulmonary;;   CESAREAN SECTION     CESAREAN SECTION N/A    [employer]rees[employer] 07/19/2020   COLONOSCOPY  2016   ENDOBRONCHIAL ULTRASOUND  02/27/2022   Procedure: ENDOBRONCHIAL ULTRASOUND;  Surgeon: Leslye Peer, MD;  Location: [facility] ENDOSCOPY;  Service: Pulmonary;;   KNEE ARTHROSCOPY     right knee   THORACOTOMY/LOBECTOMY     right upper-pulmonary aspergillosis   VIDEO BRONCHOSCOPY WITH RADIAL ENDOBRONCHIAL ULTRASOUND  02/27/2022   Procedure: VIDEO BRONCHOSCOPY WITH RADIAL ENDOBRONCHIAL ULTRASOUND;  Surgeon: Leslye Peer, MD;  Location: [facility] ENDOSCOPY;  Service: Pulmonary;;   Health Maintenance  Topic Date Due   Cervical Cancer Screening (HPV/Pap Cotest)  11/13/2021   COVID-19 Vaccine (5 - 2024-25 season) 02/08/2024 (Originally 07/01/2023)   MAMMOGRAM  06/04/2025   Colonoscopy  10/11/2025   DTaP/Tdap/Td (2 - Td or Tdap) 09/26/2027   INFLUENZA VACCINE  Completed   Hepatitis C Screening  Completed   HIV Screening  Completed   Zoster Vaccines- Shingrix  Completed   HPV VACCINES  Aged Out    ROS:  Feeling well. No dyspnea or chest pain on exertion.  No abdominal pain, change in bowel habits, black or bloody stools.  No urinary tract symptoms. GYN ROS: no breast pain or new or enlarging lumps on self exam, no vaginal bleeding. No neurological complaints.  Objective:   The patient appears well, alert, oriented x 3, in no distress. BP 120/64   Pulse 66   Temp 97.9 F (36.6 C)   Ht 5\' 3"  (1.6 m)   Wt 145 lb (65.8 kg)   LMP 03/04/2015   SpO2 99%   BMI 25.69 kg/m  ENT normal.  Neck supple. No adenopathy or thyromegaly. PERLA. Lungs are  clear, good air entry, no wheezes, rhonchi or rales. S1 and S2 normal, no murmurs, regular rate and rhythm. Abdomen soft without tenderness, guarding, mass or organomegaly. Extremities show no edema, normal peripheral pulses. Neurological is normal, no focal findings.  BREAST EXAM: breasts appear normal, no suspicious masses, no skin or nipple changes or axillary nodes  PELVIC EXAM: normal external genitalia, vulva, vagina, cervix, uterus and adnexa, PAP: Pap smear done today, exam chaperoned by Germaine [facility]  Assessment & Plan:   well woman  PLAN:  mammogram pap smear return annually or prn    Physical exam, annual Assessment & Plan: Today your medical history was reviewed and routine physical exam with labs was performed. Recommend 150 minutes of moderate intensity exercise weekly and consuming a well-balanced diet. Advised to stop smoking if a smoker, avoid smoking if a non-smoker, limit alcohol consumption to 1 drink per day for women and 2 drinks per day for men, and avoid illicit drug use. Counseled on safe sex practices and offered STI testing today. Vaccine maintenance discussed. Appropriate health maintenance items reviewed. Return to office in 1 year for annual physical exam.   Orders: -     Pap, TP Imaging w/ CT/GC and w/ HPV RNA, rflx HPV Type 16/18 -     CBC with Differential/Platelet -     COMPLETE METABOLIC PANEL WITH GFR -     Lipid panel -     TSH -     VITAMIN D 25 Hydroxy (Vit-D Deficiency, Fractures)  History of vitamin D deficiency -     VITAMIN D 25 Hydroxy (Vit-D Deficiency, Fractures)  Screening for thyroid disorder -     TSH  Cervical cancer screening Assessment & Plan: Normal PAP. Follow up as indicated by results.  Orders: -     Pap, TP Imaging w/ CT/GC and w/ HPV RNA, rflx HPV Type 16/18  Recurrent cold sores Assessment & Plan: These occur about once a month. Discussed continuing treatment versus suppressive therapy and she would like  suppressive therapy. Start Valacyclovir 500mg  daily, if ineffective may increase to 1000mg  daily. Follow up if symptoms persist or worsen.    Other orders -     valACYclovir HCl; Take 1 tablet (500 mg total) by mouth daily. for suppressive therapy  Dispense: 90 tablet; Refill: 1     Follow up plan: Return in about 1 year (around 01/22/2025).  Park Meo, FNP

## 2024-01-23 NOTE — Assessment & Plan Note (Signed)
 Normal PAP. Follow up as indicated by results.

## 2024-01-23 NOTE — Assessment & Plan Note (Signed)
 Today your medical history was reviewed and routine physical exam with labs was performed. Recommend 150 minutes of moderate intensity exercise weekly and consuming a well-balanced diet. Advised to stop smoking if a smoker, avoid smoking if a non-smoker, limit alcohol consumption to 1 drink per day for women and 2 drinks per day for men, and avoid illicit drug use. Counseled on safe sex practices and offered STI testing today. Vaccine maintenance discussed. Appropriate health maintenance items reviewed. Return to office in 1 year for annual physical exam.

## 2024-01-23 NOTE — Assessment & Plan Note (Signed)
 These occur about once a month. Discussed continuing treatment versus suppressive therapy and she would like suppressive therapy. Start Valacyclovir 500mg  daily, if ineffective may increase to 1000mg  daily. Follow up if symptoms persist or worsen.

## 2024-01-27 LAB — PAP, TP IMAGING W/ HPV RNA, RFLX HPV TYPE 16,18/45: HPV DNA High Risk: NOT DETECTED

## 2024-01-27 LAB — C. TRACHOMATIS/N. GONORRHOEAE RNA
C. trachomatis RNA, TMA: NOT DETECTED
N. gonorrhoeae RNA, TMA: NOT DETECTED

## 2024-01-27 LAB — PAP, TP IMAGING W/ CT/GC AND W/ HPV RNA, RFLX HPV TYPE 16/18

## 2024-01-28 ENCOUNTER — Encounter: Payer: Self-pay | Admitting: Family Medicine

## 2024-01-30 ENCOUNTER — Encounter: Payer: BC Managed Care – PPO | Admitting: Family Medicine

## 2024-02-21 ENCOUNTER — Encounter: Admitting: Family Medicine

## 2024-03-06 ENCOUNTER — Other Ambulatory Visit: Payer: Self-pay | Admitting: Family Medicine

## 2024-05-03 ENCOUNTER — Other Ambulatory Visit: Payer: Self-pay | Admitting: Family Medicine

## 2024-05-03 DIAGNOSIS — R300 Dysuria: Secondary | ICD-10-CM

## 2024-05-08 ENCOUNTER — Telehealth: Payer: Self-pay

## 2024-05-08 NOTE — Telephone Encounter (Signed)
 Copied from CRM [id number]. Topic: Clinical - Medication Question >> May 08, 2024  9:07 AM Treva T wrote: Reason for CRM: Patient calling, states pharmacy has attempted to request refill on medication, escitalopram  (LEXAPRO ) 10 MG tablet.  Per patient, would like to know, if that dose can be increased to Lexapro  15mg .  Patient can be reached at [phone] to discuss further.    Preferred pharmacy:  [id number] - [place], [place] - [address] AT [address] OF [address] [address] [place] [address] Phone: [phone] Fax: [phone]

## 2024-05-09 ENCOUNTER — Other Ambulatory Visit: Payer: Self-pay | Admitting: Family Medicine

## 2024-05-09 MED ORDER — ESCITALOPRAM OXALATE 20 MG PO TABS: 20.0000 mg | ORAL_TABLET | Freq: Every day | ORAL | 3 refills | Status: AC

## 2024-06-25 ENCOUNTER — Encounter: Payer: Self-pay | Admitting: Family Medicine

## 2024-06-25 ENCOUNTER — Ambulatory Visit: Payer: Self-pay | Admitting: Family Medicine

## 2024-06-25 ENCOUNTER — Ambulatory Visit (INDEPENDENT_AMBULATORY_CARE_PROVIDER_SITE_OTHER): Admitting: Family Medicine

## 2024-06-25 VITALS — BP 110/70 | HR 61 | Temp 98.6°F | Ht 63.0 in | Wt 142.1 lb

## 2024-06-25 DIAGNOSIS — B880 Other acariasis: Secondary | ICD-10-CM | POA: Diagnosis not present

## 2024-06-25 DIAGNOSIS — G4700 Insomnia, unspecified: Secondary | ICD-10-CM | POA: Diagnosis not present

## 2024-06-25 DIAGNOSIS — L299 Pruritus, unspecified: Secondary | ICD-10-CM

## 2024-06-25 MED ORDER — TRIAMCINOLONE ACETONIDE 0.1 % EX CREA: 1.0000 | TOPICAL_CREAM | Freq: Two times a day (BID) | CUTANEOUS | 1 refills | Status: DC

## 2024-06-25 MED ORDER — HYDROXYZINE HCL 50 MG PO TABS: 50.0000 mg | ORAL_TABLET | Freq: Three times a day (TID) | ORAL | 0 refills | Status: AC | PRN

## 2024-06-25 NOTE — Progress Notes (Signed)
 Patient Office Visit  Assessment & Plan:  Chigger bites -     hydrOXYzine  HCl; Take 1 tablet (50 mg total) by mouth 3 (three) times daily as needed. Take at night time  Dispense: 30 tablet; Refill: 0 -     Triamcinolone  Acetonide; Apply 1 Application topically 2 (two) times daily.  Dispense: 45 g; Refill: 1  Itching -     hydrOXYzine  HCl; Take 1 tablet (50 mg total) by mouth 3 (three) times daily as needed. Take at night time  Dispense: 30 tablet; Refill: 0 -     Triamcinolone  Acetonide; Apply 1 Application topically 2 (two) times daily.  Dispense: 45 g; Refill: 1  Insomnia, unspecified type   Assessment and Plan    Chigger bites with pruritus and excoriation Chigger bites on buttocks, lower abdomen, and right leg with significant pruritus and excoriation. Cortisone spray provides limited relief. No infection present. - Prescribe hydroxyzine  for nighttime pruritus. - Advise over-the-counter Claritin or Allegra during the day. - Prescribe steroid cream for inflammation and itching. - Instruct to contact office if condition worsens or does not improve for possible oral prednisone.  Chronic diphenhydramine use for insomnia Chronic diphenhydramine use for insomnia with awareness of risks including dementia and falls. Melatonin ineffective. - Discuss risks of long-term diphenhydramine use. - Recommend alternative sleep aids like magnesium or prescription options such as trazodone.       Return if symptoms worsen or fail to improve.   Subjective:    Patient ID: Christina [name], female    DOB: 1966/10/26  Age: 58 y.o. MRN: [id number]  Chief Complaint  Patient presents with   Insect Bite    Pt states she was bit by chiggers on Saturday morning. Mostly on L leg, abdomen and buttocks. A few bites on R leg.    HPI Discussed the use of AI scribe software for clinical note transcription with the patient, who gave verbal consent to proceed.  History of Present Illness        Christina [name] is a 58 year old female who presents with itching and rash after insect bites/chiggers.  She has been experiencing itching and rash since Saturday morning after being outdoors for work in [employer]. The rash is located on both buttocks, lower abdomen, and predominantly on the right thigh and lower leg, with a few spots on the left leg. The itching is worse at night, and she has been using a cortisone spray for relief. patient does not like to take oral prednisone  She wore long pants and used a natural bug repellent without DEET, which did not prevent the bites. She has been taking two Benadryl at night to help with the itching and sleep, which makes her sleepy but not groggy the next day. She has also tried using an ice pack on her right foot at night to numb the itching, which she finds helpful.  She has a history of using Benadryl as a sleep aid for the past three to four years and is concerned about potential long-term effects such as dementia. She has tried melatonin in the past without success and is currently trying magnesium as recommended by a friend.  She is concerned about the appearance of the rash, stating that it looks bad and is uncomfortable, affecting her clothing choices. She has been wearing baggy long dresses to avoid scratching the rash and to cover it up.  She reports scratching it until it bleeds, which causes burning when  applying sprays or taking showers. Physical Exam SKIN: Multiple insect bites on buttocks and primarily right lower extremity, and lower abdomen. No signs of infection in affected areas. Results Assessment & Plan Chigger bites with pruritus and excoriation Chigger bites on buttocks, lower abdomen, and right leg with significant pruritus and excoriation. Cortisone spray provides limited relief. No infection present. - Prescribe hydroxyzine  for nighttime pruritus. - Advise over-the-counter Claritin or Allegra during the day. - Prescribe steroid cream  for inflammation and itching. - Instruct to contact office if condition worsens or does not improve for possible oral prednisone.  Chronic diphenhydramine use for insomnia Chronic diphenhydramine use for insomnia with awareness of risks including dementia and falls. Melatonin ineffective. - Discuss risks of long-term diphenhydramine use. - Recommend alternative sleep aids like magnesium or prescription options such as trazodone.     The 10-year ASCVD risk score (Arnett DK, et al., 2019) is: 1.1%  Past Medical History:  Diagnosis Date   Allergy    Anxiety    Cancer (HCC)    Depression    Hx of adenomatous and sessile serrated colonic polyps 03/19/2015   Hyperlipidemia    Mass of upper lobe of right lung    biopsy showed pulmoary aspergillosis   Pulmonary aspergillosis [facility])    Past Surgical History:  Procedure Laterality Date   BRONCHIAL BIOPSY  02/27/2022   Procedure: BRONCHIAL BIOPSIES;  Surgeon: Shelah Lamar RAMAN, MD;  Location: [facility] ENDOSCOPY;  Service: Pulmonary;;   BRONCHIAL BRUSHINGS  02/27/2022   Procedure: BRONCHIAL BRUSHINGS;  Surgeon: Shelah Lamar RAMAN, MD;  Location: [facility] ENDOSCOPY;  Service: Pulmonary;;   BRONCHIAL NEEDLE ASPIRATION BIOPSY  02/27/2022   Procedure: BRONCHIAL NEEDLE ASPIRATION BIOPSIES;  Surgeon: Shelah Lamar RAMAN, MD;  Location: [facility] ENDOSCOPY;  Service: Pulmonary;;   CESAREAN SECTION     CESAREAN SECTION N/A    Phreesia 07/19/2020   COLONOSCOPY  2016   ENDOBRONCHIAL ULTRASOUND  02/27/2022   Procedure: ENDOBRONCHIAL ULTRASOUND;  Surgeon: Shelah Lamar RAMAN, MD;  Location: [facility] ENDOSCOPY;  Service: Pulmonary;;   KNEE ARTHROSCOPY     right knee   THORACOTOMY/LOBECTOMY     right upper-pulmonary aspergillosis   VIDEO BRONCHOSCOPY WITH RADIAL ENDOBRONCHIAL ULTRASOUND  02/27/2022   Procedure: VIDEO BRONCHOSCOPY WITH RADIAL ENDOBRONCHIAL ULTRASOUND;  Surgeon: Shelah Lamar RAMAN, MD;  Location: [facility] ENDOSCOPY;  Service: Pulmonary;;   Social History   Tobacco Use    Smoking status: Former    Current packs/day: 0.00    Types: Cigarettes    Quit date: 03/18/2002    Years since quitting: 22.2   Smokeless tobacco: Never  Vaping Use   Vaping status: Never Used  Substance Use Topics   Alcohol use: Yes    Alcohol/week: 4.0 standard drinks of alcohol    Types: 4 Glasses of wine per week    Comment: 4-5 drinks per week per pt   Drug use: No   Family History  Problem Relation Age of Onset   Bladder Cancer Mother    Hypertension Mother    Hyperlipidemia Mother    Colon cancer Father 32   Cancer Father 64       colon cancer    Bipolar disorder Sister    Bipolar disorder Sister    Heart disease Brother 88       Heart Attack   Alcohol abuse Brother    Depression Maternal Uncle    Cancer Paternal Uncle    Colon cancer Maternal Grandmother    Heart disease Maternal Grandmother  Cancer Paternal Grandmother        Breast cancer    Colon polyps Neg Hx    Esophageal cancer Neg Hx    Rectal cancer Neg Hx    Stomach cancer Neg Hx    No Known Allergies  ROS    Objective:    BP 110/70   Pulse 61   Temp 98.6 F (37 C)   Ht 5' 3 (1.6 m)   Wt 142 lb 2 oz (64.5 kg)   LMP 03/04/2015   SpO2 99%   BMI 25.18 kg/m  BP Readings from Last 3 Encounters:  06/25/24 110/70  01/23/24 120/64  07/03/23 102/80   Wt Readings from Last 3 Encounters:  06/25/24 142 lb 2 oz (64.5 kg)  01/23/24 145 lb (65.8 kg)  07/03/23 144 lb (65.3 kg)    Physical Exam Vitals and nursing note reviewed.  Constitutional:      General: She is not in acute distress.    Appearance: Normal appearance.  HENT:     Head: Normocephalic.  Musculoskeletal:     Right lower leg: No edema.     Left lower leg: No edema.  Skin:    Findings: Erythema and rash present.     Comments: Extensive 2-3 mm maculopapular erythematous lesions throughout the right 5 lower leg buttocks and a few scattered lesions on the left leg.  Patient has excoriated lesions over the top of the right  foot.  No streaking noted no purulence from the bites  Neurological:     General: No focal deficit present.     Mental Status: She is alert and oriented to person, place, and time.  Psychiatric:        Mood and Affect: Mood normal.      No results found for any visits on 06/25/24.

## 2024-06-25 NOTE — Telephone Encounter (Signed)
 FYI Only or Action Required?: FYI only for provider.  Patient was last seen in primary care on 01/23/2024 by Kayla Jeoffrey RAMAN, FNP.  Called Nurse Triage reporting Rash.  Symptoms began several days ago.  Interventions attempted: OTC medications: cortisone, ice .  Symptoms are: gradually worsening.  Triage Disposition: See Physician Within 24 Hours  Patient/caregiver understands and will follow disposition?: Yes           Reason for Disposition  [1] Looks infected (e.g., spreading redness, pus) AND [2] no fever  Answer Assessment - Initial Assessment Questions Appt scheduled today with other provider due  to worsening itching . None available with PCP  until tomorrow. Possible chigger bites    1. APPEARANCE of RASH: What does the rash look like? (e.g., blisters, dry flaky skin, red spots, redness, sores)     Red spots since scratching, bumps  2. LOCATION: Where is the rash located?      Legs, feet, buttocks, stomach,back 3. NUMBER: How many spots are there?      Multiple  4. SIZE: How big are the spots? (e.g., inches, cm; or compare to size of pinhead, tip of pen, eraser, pea)      Na  5. ONSET: When did the rash start?      Saturday  6. ITCHING: Does the rash itch? If Yes, ask: How bad is the itch?  (Scale 0-10; or none, mild, moderate, severe)     Itching worse at night  7. PAIN: Does the rash hurt? If Yes, ask: How bad is the pain?  (Scale 0-10; or none, mild, moderate, severe)     Pain when taking shower, burning  8. OTHER SYMPTOMS: Do you have any other symptoms? (e.g., fever)     Bumps red, itching, possible chiggers 9. PREGNANCY: Is there any chance you are pregnant? When was your last menstrual period?     na  Protocols used: Rash or Redness - Localized-A-AH

## 2024-11-03 ENCOUNTER — Ambulatory Visit: Payer: Self-pay

## 2024-11-03 ENCOUNTER — Telehealth: Payer: Self-pay | Admitting: Family Medicine

## 2024-11-03 NOTE — Telephone Encounter (Signed)
 Per other document sent to PCP office, patient denies NT and just wants to schedule an appointment.    Copied from CRM [id number]. Topic: Clinical - Red Word Triage >> Nov 03, 2024  8:18 AM Myrick T wrote: Red Word that prompted transfer to Nurse Triage: bump on the inside of eyelid leaking fluid that is painful. Her vision is blurry.

## 2024-11-03 NOTE — Telephone Encounter (Signed)
 Duplicate, clinic aware, see other NT encounter

## 2024-11-03 NOTE — Telephone Encounter (Signed)
 Copied from CRM [id number]. Topic: Clinical - Refused Triage >> Nov 03, 2024  8:29 AM Myrick T wrote: Patient/caller voiced complaints of a painful bump under her right eye lid that is leaking fluid and has gotten worse over the past week. He vision is also blurry. Patient  refused nurse triage due to the hold time and wanted to just schedule an appt . Declined transfer to triage.

## 2024-11-04 ENCOUNTER — Ambulatory Visit: Admitting: Family Medicine

## 2024-11-06 ENCOUNTER — Ambulatory Visit

## 2024-11-06 ENCOUNTER — Ambulatory Visit: Admission: EM | Admit: 2024-11-06 | Discharge: 2024-11-06 | Disposition: A | Discharge status: Home / Self Care

## 2024-11-06 ENCOUNTER — Other Ambulatory Visit: Payer: Self-pay | Admitting: Family Medicine

## 2024-11-06 DIAGNOSIS — H00022 Hordeolum internum right lower eyelid: Secondary | ICD-10-CM

## 2024-11-06 NOTE — Discharge Instructions (Addendum)
 Continue warm compresses to the right eye to help allow drainage.  This should resolve on its own in the next few days.

## 2024-11-06 NOTE — ED Provider Notes (Signed)
 " [facility]    CSN: [id number] Arrival date & time: 11/06/24  1020      History   Chief Complaint No chief complaint on file.   HPI Christina [name] is a 59 y.o. female.   Presents today with 4-day history of right lower eyelid pain, swelling, redness, and drainage.  Reports history of dry eye, occasionally uses steroid eyedrops prescribed by her eye doctor to help.  Reports 3 to 4 days ago, the eye was very red and swollen but this has since improved.  Reports there is a painful bump on the  lower eyelid but is decreasing in size.  She endorses yellow mucus and thick crusty drainage when she woke up this morning and wanted to make sure she did not have an eye infection.  Patient denies blurred or double vision, changes in vision,  foreign body sensation.    Past Medical History:  Diagnosis Date   Allergy    Anxiety    Cancer (HCC)    Depression    Hx of adenomatous and sessile serrated colonic polyps 03/19/2015   Hyperlipidemia    Mass of upper lobe of right lung    biopsy showed pulmoary aspergillosis   Pulmonary aspergillosis [facility])     Patient Active Problem List   Diagnosis Date Noted   Cervical cancer screening 01/23/2024   Recurrent cold sores 01/23/2024   Bacterial vaginosis 07/03/2023   Human papilloma virus infection 03/14/2023   History of adenomatous polyp of colon 03/06/2023   Physical exam, annual 09/27/2022   Muscle spasm 09/27/2022   History of nonmelanoma skin cancer 08/03/2022   Abnormal CT of the chest 01/31/2022   Thyroid  nodule 01/31/2022   Herpes labialis 12/27/2020   Reduced libido 12/27/2020   Family history of malignant neoplasm of gastrointestinal tract 11/25/2020   Family history of neoplasm of breast 11/07/2017   Tinnitus 01/11/2016   Dysfunction of both eustachian tubes 01/11/2016   Sensorineural hearing loss (SNHL), bilateral 01/11/2016   Family history of colon cancer 03/31/2015   Hx of adenomatous and sessile serrated  colonic polyps 03/19/2015   Depression with anxiety 06/17/2010   GERD 06/17/2010   FLANK PAIN, LEFT 06/16/2010    Past Surgical History:  Procedure Laterality Date   BRONCHIAL BIOPSY  02/27/2022   Procedure: BRONCHIAL BIOPSIES;  Surgeon: Shelah Lamar RAMAN, MD;  Location: [facility];  Service: Pulmonary;;   BRONCHIAL BRUSHINGS  02/27/2022   Procedure: BRONCHIAL BRUSHINGS;  Surgeon: Shelah Lamar RAMAN, MD;  Location: [facility];  Service: Pulmonary;;   BRONCHIAL NEEDLE ASPIRATION BIOPSY  02/27/2022   Procedure: BRONCHIAL NEEDLE ASPIRATION BIOPSIES;  Surgeon: Shelah Lamar RAMAN, MD;  Location: [facility] ENDOSCOPY;  Service: Pulmonary;;   CESAREAN SECTION     CESAREAN SECTION N/A    [employer] 07/19/2020   COLONOSCOPY  2016   ENDOBRONCHIAL ULTRASOUND  02/27/2022   Procedure: ENDOBRONCHIAL ULTRASOUND;  Surgeon: Shelah Lamar RAMAN, MD;  Location: [facility];  Service: Pulmonary;;   KNEE ARTHROSCOPY     right knee   THORACOTOMY/LOBECTOMY     right upper-pulmonary aspergillosis   VIDEO BRONCHOSCOPY WITH RADIAL ENDOBRONCHIAL ULTRASOUND  02/27/2022   Procedure: VIDEO BRONCHOSCOPY WITH RADIAL ENDOBRONCHIAL ULTRASOUND;  Surgeon: Shelah Lamar RAMAN, MD;  Location: [facility];  Service: Pulmonary;;    OB History   No obstetric history on file.      Home Medications    Prior to Admission medications  Medication Sig Start Date End Date Taking? Authorizing Provider  ALPRAZolam  (XANAX )  0.25 MG tablet TAKE 1 TABLET BY MOUTH AT BEDTIME AS NEEDED FOR ANXIETY. 03/07/24   Duanne Butler DASEN, MD  B Complex Vitamins (B COMPLEX 50 PO) Take 1 tablet by mouth daily.    [provider]  cholecalciferol (VITAMIN D ) 1000 units tablet Take 1,000 Units by mouth daily.    [provider]  diphenhydrAMINE (BENADRYL) 25 MG tablet Take 25 mg by mouth at bedtime.    [provider]  escitalopram  (LEXAPRO ) 20 MG tablet Take 1 tablet (20 mg total) by mouth daily. 05/09/24   Duanne Butler DASEN, MD   hydrOXYzine  (ATARAX ) 50 MG tablet Take 1 tablet (50 mg total) by mouth 3 (three) times daily as needed. Take at night time 06/25/24   Aguiar, Rafaela, MD  loteprednol (ALREX) 0.2 % SUSP Place 2 drops into the right eye 2 (two) times daily.    [provider]  meloxicam  (MOBIC ) 15 MG tablet TAKE 1 TABLET BY MOUTH EVERY DAY AS NEEDED FOR PAIN 03/21/21   Duanne Butler DASEN, MD  triamcinolone  cream (KENALOG ) 0.1 % Apply 1 Application topically 2 (two) times daily. 06/25/24   Aletha Bene, MD  valACYclovir  (VALTREX ) 500 MG tablet Take 1 tablet (500 mg total) by mouth daily. for suppressive therapy Patient not taking: Reported on 06/25/2024 01/23/24   Kayla Jeoffrey RAMAN, FNP  escitalopram  (LEXAPRO ) 5 MG tablet TAKE 1 TABLET BY MOUTH EVERYDAY AT BEDTIME Patient not taking: Reported on 01/23/2024 07/17/22   Duanne Butler DASEN, MD    Family History Family History  Problem Relation Age of Onset   Bladder Cancer Mother    Hypertension Mother    Hyperlipidemia Mother    Colon cancer Father 31   Cancer Father 43       colon cancer    Bipolar disorder Sister    Bipolar disorder Sister    Heart disease Brother 87       Heart Attack   Alcohol abuse Brother    Depression Maternal Uncle    Cancer Paternal Uncle    Colon cancer Maternal Grandmother    Heart disease Maternal Grandmother    Cancer Paternal Grandmother        Breast cancer    Colon polyps Neg Hx    Esophageal cancer Neg Hx    Rectal cancer Neg Hx    Stomach cancer Neg Hx     Social History Social History[1]   Allergies   Patient has no known allergies.   Review of Systems Review of Systems Per HPI  Physical Exam Triage Vital Signs ED Triage Vitals  Encounter Vitals Group     BP 11/06/24 1031 115/68     Girls Systolic BP Percentile --      Girls Diastolic BP Percentile --      Boys Systolic BP Percentile --      Boys Diastolic BP Percentile --      Pulse Rate 11/06/24 1031 61     Resp 11/06/24 1031 16     Temp  11/06/24 1031 98.5 F (36.9 C)     Temp Source 11/06/24 1031 Oral     SpO2 11/06/24 1031 98 %     Weight --      Height --      Head Circumference --      Peak Flow --      Pain Score 11/06/24 1033 0     Pain Loc --      Pain Education --  Exclude from Growth Chart --    No data found.  Updated Vital Signs BP 115/68 (BP Location: Right Arm)   Pulse 61   Temp 98.5 F (36.9 C) (Oral)   Resp 16   LMP 03/04/2015   SpO2 98%   Visual Acuity Right Eye Distance:   Left Eye Distance:   Bilateral Distance:    Right Eye Near:   Left Eye Near:    Bilateral Near:     Physical Exam Vitals and nursing note reviewed.  Constitutional:      General: She is not in acute distress.    Appearance: Normal appearance. She is not toxic-appearing.  HENT:     Head: Normocephalic and atraumatic.     Mouth/Throat:     Mouth: Mucous membranes are moist.     Pharynx: Oropharynx is clear.  Eyes:     General:        Right eye: Hordeolum present. No discharge.        Left eye: No discharge.     Extraocular Movements: Extraocular movements intact.     Right eye: Normal extraocular motion.     Conjunctiva/sclera: Conjunctivae normal.     Pupils: Pupils are equal, round, and reactive to light.   Cardiovascular:     Rate and Rhythm: Normal rate.  Pulmonary:     Effort: Pulmonary effort is normal. No respiratory distress.  Musculoskeletal:     Cervical back: Normal range of motion.  Lymphadenopathy:     Cervical: No cervical adenopathy.  Skin:    General: Skin is warm and dry.     Capillary Refill: Capillary refill takes less than 2 seconds.     Coloration: Skin is not jaundiced or pale.     Findings: No erythema or rash.  Neurological:     Mental Status: She is alert and oriented to person, place, and time.  Psychiatric:        Behavior: Behavior is cooperative.      UC Treatments / Results  Labs (all labs ordered are listed, but only abnormal results are displayed) Labs  Reviewed - No data to display  EKG   Radiology No results found.  Procedures Procedures (including critical care time)  Medications Ordered in UC Medications - No data to display  Initial Impression / Assessment and Plan / UC Course  I have reviewed the triage vital signs and the nursing notes.  Pertinent labs & imaging results that were available during my care of the patient were reviewed by me and considered in my medical decision making (see chart for details).   Patient is a pleasant, well-appearing 59 year old female presenting today for stye of the right eye.  Vital signs are stable and exam is reassuring.  Supportive care discussed-continue warm compresses.  We discussed no need for antibiotic eyedrops or ointment at this time as there is no conjunctivitis.  Return and ER precautions were discussed.  The patient was given the opportunity to ask questions.  All questions answered to their satisfaction.  The patient is in agreement to this plan.   Final Clinical Impressions(s) / UC Diagnoses   Final diagnoses:  Hordeolum internum of right lower eyelid     Discharge Instructions      Continue warm compresses to the right eye to help allow drainage.  This should resolve on its own in the next few days.    ED Prescriptions   None    PDMP not reviewed this encounter.     [  1]  Social History Tobacco Use   Smoking status: Former    Current packs/day: 0.00    Types: Cigarettes    Quit date: 03/18/2002    Years since quitting: 22.6   Smokeless tobacco: Never  Vaping Use   Vaping status: Never Used  Substance Use Topics   Alcohol use: Yes    Alcohol/week: 4.0 standard drinks of alcohol    Types: 4 Glasses of wine per week    Comment: 4-5 drinks per week per pt   Drug use: No     Chandra Harlene LABOR, NP 11/06/24 1650  "

## 2024-11-06 NOTE — ED Triage Notes (Signed)
 Pt reports swelling in the right lower eye lid, also tender to the touch, discharge crusted over in the mornings. Started Sunday, Monday morning eye was swollen shut.

## 2024-11-10 ENCOUNTER — Ambulatory Visit
Admission: EM | Admit: 2024-11-10 | Discharge: 2024-11-10 | Disposition: A | Discharge status: Home / Self Care | Attending: Nurse Practitioner | Admitting: Nurse Practitioner

## 2024-11-10 ENCOUNTER — Ambulatory Visit

## 2024-11-10 DIAGNOSIS — S52512A Displaced fracture of left radial styloid process, initial encounter for closed fracture: Secondary | ICD-10-CM

## 2024-11-10 NOTE — ED Triage Notes (Signed)
 Pt reports pain in the left wrist after a fall while walking the x 1 hr, pain is localized.

## 2024-11-10 NOTE — ED Provider Notes (Signed)
 " [facility]    CSN: [id number] Arrival date & time: 11/10/24  1802      History   Chief Complaint No chief complaint on file.   HPI Christina [name] is a 60 y.o. female.   The history is provided by the patient.   Patient presents for complaints of left wrist pain after a fall approximately 1 hour ago.  Patient states she was walking her dog when her dog became entangled in her legs causing her to fall.  She states that she believes that she did have her hand outstretched when she fell to catch herself.  Denies numbness or tingling, but complains of pain in the left wrist immediately below her thumb.  She also endorses decreased range of motion.  Denies bruising, erythema, numbness, or tingling.  States that she did apply ice, took hydrocodone  and Advil  for her symptoms.  Past Medical History:  Diagnosis Date   Allergy    Anxiety    Cancer (HCC)    Depression    Hx of adenomatous and sessile serrated colonic polyps 03/19/2015   Hyperlipidemia    Mass of upper lobe of right lung    biopsy showed pulmoary aspergillosis   Pulmonary aspergillosis [facility])     Patient Active Problem List   Diagnosis Date Noted   Cervical cancer screening 01/23/2024   Recurrent cold sores 01/23/2024   Bacterial vaginosis 07/03/2023   Human papilloma virus infection 03/14/2023   History of adenomatous polyp of colon 03/06/2023   Physical exam, annual 09/27/2022   Muscle spasm 09/27/2022   History of nonmelanoma skin cancer 08/03/2022   Abnormal CT of the chest 01/31/2022   Thyroid  nodule 01/31/2022   Herpes labialis 12/27/2020   Reduced libido 12/27/2020   Family history of malignant neoplasm of gastrointestinal tract 11/25/2020   Family history of neoplasm of breast 11/07/2017   Tinnitus 01/11/2016   Dysfunction of both eustachian tubes 01/11/2016   Sensorineural hearing loss (SNHL), bilateral 01/11/2016   Family history of colon cancer 03/31/2015   Hx of adenomatous and sessile  serrated colonic polyps 03/19/2015   Depression with anxiety 06/17/2010   GERD 06/17/2010   FLANK PAIN, LEFT 06/16/2010    Past Surgical History:  Procedure Laterality Date   BRONCHIAL BIOPSY  02/27/2022   Procedure: BRONCHIAL BIOPSIES;  Surgeon: Shelah Lamar RAMAN, MD;  Location: [facility];  Service: Pulmonary;;   BRONCHIAL BRUSHINGS  02/27/2022   Procedure: BRONCHIAL BRUSHINGS;  Surgeon: Shelah Lamar RAMAN, MD;  Location: [facility];  Service: Pulmonary;;   BRONCHIAL NEEDLE ASPIRATION BIOPSY  02/27/2022   Procedure: BRONCHIAL NEEDLE ASPIRATION BIOPSIES;  Surgeon: Shelah Lamar RAMAN, MD;  Location: [facility] ENDOSCOPY;  Service: Pulmonary;;   CESAREAN SECTION     CESAREAN SECTION N/A    [employer] 07/19/2020   COLONOSCOPY  2016   ENDOBRONCHIAL ULTRASOUND  02/27/2022   Procedure: ENDOBRONCHIAL ULTRASOUND;  Surgeon: Shelah Lamar RAMAN, MD;  Location: [facility];  Service: Pulmonary;;   KNEE ARTHROSCOPY     right knee   THORACOTOMY/LOBECTOMY     right upper-pulmonary aspergillosis   VIDEO BRONCHOSCOPY WITH RADIAL ENDOBRONCHIAL ULTRASOUND  02/27/2022   Procedure: VIDEO BRONCHOSCOPY WITH RADIAL ENDOBRONCHIAL ULTRASOUND;  Surgeon: Shelah Lamar RAMAN, MD;  Location: [facility];  Service: Pulmonary;;    OB History   No obstetric history on file.      Home Medications    Prior to Admission medications  Medication Sig Start Date End Date Taking? Authorizing Provider  ALPRAZolam  (XANAX ) 0.25  MG tablet TAKE 1 TABLET BY MOUTH AT BEDTIME AS NEEDED FOR ANXIETY 11/07/24   Duanne Butler DASEN, MD  B Complex Vitamins (B COMPLEX 50 PO) Take 1 tablet by mouth daily.    [provider]  cholecalciferol (VITAMIN D ) 1000 units tablet Take 1,000 Units by mouth daily.    [provider]  diphenhydrAMINE (BENADRYL) 25 MG tablet Take 25 mg by mouth at bedtime.    [provider]  escitalopram  (LEXAPRO ) 20 MG tablet Take 1 tablet (20 mg total) by mouth daily. 05/09/24   Duanne Butler DASEN, MD   hydrOXYzine  (ATARAX ) 50 MG tablet Take 1 tablet (50 mg total) by mouth 3 (three) times daily as needed. Take at night time 06/25/24   Aguiar, Rafaela, MD  loteprednol (ALREX) 0.2 % SUSP Place 2 drops into the right eye 2 (two) times daily.    [provider]  meloxicam  (MOBIC ) 15 MG tablet TAKE 1 TABLET BY MOUTH EVERY DAY AS NEEDED FOR PAIN 03/21/21   Duanne Butler DASEN, MD  triamcinolone  cream (KENALOG ) 0.1 % Apply 1 Application topically 2 (two) times daily. 06/25/24   Aletha Bene, MD  valACYclovir  (VALTREX ) 500 MG tablet Take 1 tablet (500 mg total) by mouth daily. for suppressive therapy Patient not taking: Reported on 06/25/2024 01/23/24   Kayla Jeoffrey RAMAN, FNP  escitalopram  (LEXAPRO ) 5 MG tablet TAKE 1 TABLET BY MOUTH EVERYDAY AT BEDTIME Patient not taking: Reported on 01/23/2024 07/17/22   Duanne Butler DASEN, MD    Family History Family History  Problem Relation Age of Onset   Bladder Cancer Mother    Hypertension Mother    Hyperlipidemia Mother    Colon cancer Father 24   Cancer Father 10       colon cancer    Bipolar disorder Sister    Bipolar disorder Sister    Heart disease Brother 55       Heart Attack   Alcohol abuse Brother    Depression Maternal Uncle    Cancer Paternal Uncle    Colon cancer Maternal Grandmother    Heart disease Maternal Grandmother    Cancer Paternal Grandmother        Breast cancer    Colon polyps Neg Hx    Esophageal cancer Neg Hx    Rectal cancer Neg Hx    Stomach cancer Neg Hx     Social History Social History[1]   Allergies   Patient has no known allergies.   Review of Systems Review of Systems Per HPI  Physical Exam Triage Vital Signs ED Triage Vitals  Encounter Vitals Group     BP 11/10/24 1815 113/78     Girls Systolic BP Percentile --      Girls Diastolic BP Percentile --      Boys Systolic BP Percentile --      Boys Diastolic BP Percentile --      Pulse Rate 11/10/24 1815 (!) 58     Resp 11/10/24 1815 20      Temp 11/10/24 1815 98.6 F (37 C)     Temp Source 11/10/24 1815 Oral     SpO2 11/10/24 1815 95 %     Weight --      Height --      Head Circumference --      Peak Flow --      Pain Score 11/10/24 1818 7     Pain Loc --      Pain Education --  Exclude from Growth Chart --    No data found.  Updated Vital Signs BP 113/78 (BP Location: Right Arm)   Pulse (!) 58   Temp 98.6 F (37 C) (Oral)   Resp 20   LMP 03/04/2015   SpO2 95%   Visual Acuity Right Eye Distance:   Left Eye Distance:   Bilateral Distance:    Right Eye Near:   Left Eye Near:    Bilateral Near:     Physical Exam Vitals and nursing note reviewed.  Constitutional:      General: She is not in acute distress.    Appearance: Normal appearance.  HENT:     Head: Normocephalic.  Eyes:     Extraocular Movements: Extraocular movements intact.     Pupils: Pupils are equal, round, and reactive to light.  Pulmonary:     Effort: Pulmonary effort is normal.  Musculoskeletal:     Left wrist: Swelling (Radial aspect of the right wrist) and tenderness (Radial aspect of the right wrist) present. No snuff box tenderness. Decreased range of motion. Normal pulse.     Cervical back: Normal range of motion.  Skin:    General: Skin is warm and dry.  Neurological:     General: No focal deficit present.     Mental Status: She is alert and oriented to person, place, and time.  Psychiatric:        Mood and Affect: Mood normal.        Behavior: Behavior normal.      UC Treatments / Results  Labs (all labs ordered are listed, but only abnormal results are displayed) Labs Reviewed - No data to display  EKG   Radiology DG Wrist Complete Left Result Date: 11/10/2024 CLINICAL DATA:  Fall with wrist pain EXAM: LEFT WRIST - COMPLETE 3+ VIEW COMPARISON:  None Available. FINDINGS: Acute mildly displaced intra-articular fracture involving the radial styloid. No subluxation. Positive for soft tissue swelling IMPRESSION:  Acute mildly displaced intra-articular fracture involving the radial styloid. Electronically Signed   By: Luke Bun M.D.   On: 11/10/2024 19:32    Procedures Procedures (including critical care time)  Medications Ordered in UC Medications - No data to display  Initial Impression / Assessment and Plan / UC Course  I have reviewed the triage vital signs and the nursing notes.  Pertinent labs & imaging results that were available during my care of the patient were reviewed by me and considered in my medical decision making (see chart for details).  X-ray of the right wrist was positive for mildly displaced intra-articular radial styloid fracture.  Sugar-tong splint was applied for immobilization and support.  Supportive care recommendations were provided and discussed with the patient to include continuing current pain management, and RICE therapy.  Patient was given information for orthopedics, recommend follow-up within the next 24 to 48 hours.  Patient was in agreement with this plan of care and verbalizes understanding.  All questions were answered.  Patient stable for discharge.  Final Clinical Impressions(s) / UC Diagnoses   Final diagnoses:  Closed displaced fracture of styloid process of left radius, initial encounter     Discharge Instructions      As discussed, you do have a fracture in the left wrist. A splint has been provided for immobilization and support.  Do not remove the splint until you have been evaluated by orthopedics. Continue RICE therapy.  Rest, ice, compression, and elevation.  Continue to apply ice.  Apply for 20 minutes,  remove for 1 hour, repeat as needed while symptoms persist.  You may continue over-the-counter Tylenol  or ibuprofen  as needed for pain or discomfort. I have provided information for orthopedics.  Again please call and follow-up as soon as possible for further evaluation. Follow-up as needed.     ED Prescriptions   None    PDMP not  reviewed this encounter.    [1]  Social History Tobacco Use   Smoking status: Former    Current packs/day: 0.00    Types: Cigarettes    Quit date: 03/18/2002    Years since quitting: 22.6   Smokeless tobacco: Never  Vaping Use   Vaping status: Never Used  Substance Use Topics   Alcohol use: Yes    Alcohol/week: 4.0 standard drinks of alcohol    Types: 4 Glasses of wine per week    Comment: 4-5 drinks per week per pt   Drug use: No     Gilmer Etta PARAS, NP 11/10/24 2002  "

## 2024-11-10 NOTE — Discharge Instructions (Addendum)
 As discussed, you do have a fracture in the left wrist. A splint has been provided for immobilization and support.  Do not remove the splint until you have been evaluated by orthopedics. Continue RICE therapy.  Rest, ice, compression, and elevation.  Continue to apply ice.  Apply for 20 minutes, remove for 1 hour, repeat as needed while symptoms persist.  You may continue over-the-counter Tylenol  or ibuprofen  as needed for pain or discomfort. I have provided information for orthopedics.  Again please call and follow-up as soon as possible for further evaluation. Follow-up as needed.

## 2024-11-14 ENCOUNTER — Ambulatory Visit: Admitting: Family Medicine

## 2024-11-18 ENCOUNTER — Ambulatory Visit: Admitting: Orthopedic Surgery

## 2024-11-18 ENCOUNTER — Other Ambulatory Visit (INDEPENDENT_AMBULATORY_CARE_PROVIDER_SITE_OTHER): Payer: Self-pay

## 2024-11-18 ENCOUNTER — Encounter: Payer: Self-pay | Admitting: Orthopedic Surgery

## 2024-11-18 DIAGNOSIS — S52515A Nondisplaced fracture of left radial styloid process, initial encounter for closed fracture: Secondary | ICD-10-CM | POA: Diagnosis not present

## 2024-11-18 DIAGNOSIS — M25532 Pain in left wrist: Secondary | ICD-10-CM

## 2024-11-18 NOTE — Patient Instructions (Signed)
 Brace at all times, with the exception of hygiene  Elevate to help with swelling  Medications as needed

## 2024-11-18 NOTE — Progress Notes (Signed)
 New Patient Visit  Summary: Christina [name] is a 59 y.o. female with the following: 1. Pain in left wrist 2. Nondisp fx of left radial styloid process, init for clos fx  Assessment and Plan Assessment & Plan Left radial styloid intra-articular fracture, stable with no displacement or significant articular depression. Nonoperative management appropriate. Brace supports healing and function. Discussed favorable functional outcomes despite potential imperfect healing. Low risk of late displacement or joint incongruity given current stability. - Reviewed radiographs confirming no interval displacement. - Recommended continuous wrist brace use except for hygiene and brief breaks. - Advised against lifting and power grips with left hand. - Discussed analgesia: hydrocodone  at night, ibuprofen  during the day. Notify if additional pain control needed. - Recommended hand elevation to reduce pain and swelling. - Scheduled follow-up in two weeks with repeat radiograph to assess healing and alignment. - Instructed to return for increased pain, loss of function, or fracture displacement. - Offered referral to hand specialist if concerns arise or alignment changes on follow-up.     Follow-up: Return in about 2 weeks (around 12/02/2024).  Subjective:  Chief Complaint  Patient presents with   Fracture    L wrist DOI 11/10/24 after a fall with her dog. Pt was told she may need surgery her to get answers to some questions.      Discussed the use of AI scribe software for clinical note transcription with the patient, who gave verbal consent to proceed.  History of Present Illness Christina [name] is a 59 year old right hand dominant female who presents for evaluation of a left intra-articular radial styloid fracture sustained one week ago.  One week ago she tripped over her dog and fell onto her outstretched left hand, with immediate wrist pain and ecchymosis radiating proximally. She notes localized  tenderness and a sore area consistent with a hematoma, with visible bruising and minimal swelling.  She was evaluated at [facility] with left wrist radiographs, then seen at [facility] the next day. She has worn a wrist brace since, removing it only for hygiene. 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 She stopped boxing since the injury but can move all fingers without difficulty.  She uses hydrocodone /acetaminophen  at night as needed and ibuprofen  during the day as needed, and does not require pain medication every night.    Review of Systems: No fevers or chills No numbness or tingling No chest pain No shortness of breath No bowel or bladder dysfunction No GI distress No headaches   Medical History:  Past Medical History:  Diagnosis Date   Allergy    Anxiety    Cancer (HCC)    Depression    Hx of adenomatous and sessile serrated colonic polyps 03/19/2015   Hyperlipidemia    Mass of upper lobe of right lung    biopsy showed pulmoary aspergillosis   Pulmonary aspergillosis [facility])     Past Surgical History:  Procedure Laterality Date   BRONCHIAL BIOPSY  02/27/2022   Procedure: BRONCHIAL BIOPSIES;  Surgeon: Shelah Lamar RAMAN, MD;  Location: [facility] ENDOSCOPY;  Service: Pulmonary;;   BRONCHIAL BRUSHINGS  02/27/2022   Procedure: BRONCHIAL BRUSHINGS;  Surgeon: Shelah Lamar RAMAN, MD;  Location: [facility] ENDOSCOPY;  Service: Pulmonary;;   BRONCHIAL NEEDLE ASPIRATION BIOPSY  02/27/2022   Procedure: BRONCHIAL NEEDLE ASPIRATION BIOPSIES;  Surgeon: Shelah Lamar RAMAN, MD;  Location: [facility] ENDOSCOPY;  Service: Pulmonary;;   CESAREAN SECTION     CESAREAN SECTION N/A    [employer] 07/19/2020   COLONOSCOPY  2016   ENDOBRONCHIAL ULTRASOUND  02/27/2022   Procedure: ENDOBRONCHIAL ULTRASOUND;  Surgeon: Shelah Lamar RAMAN, MD;  Location:  [facility] ENDOSCOPY;  Service: Pulmonary;;   KNEE ARTHROSCOPY     right knee   THORACOTOMY/LOBECTOMY     right upper-pulmonary aspergillosis   VIDEO BRONCHOSCOPY WITH RADIAL ENDOBRONCHIAL ULTRASOUND  02/27/2022   Procedure: VIDEO BRONCHOSCOPY WITH RADIAL ENDOBRONCHIAL ULTRASOUND;  Surgeon: Shelah Lamar RAMAN, MD;  Location: [facility] ENDOSCOPY;  Service: Pulmonary;;    Family History  Problem Relation Age of Onset   Bladder Cancer Mother    Hypertension Mother    Hyperlipidemia Mother    Colon cancer Father 20   Cancer Father 42       colon cancer    Bipolar disorder Sister    Bipolar disorder Sister    Heart disease Brother 49       Heart Attack   Alcohol abuse Brother    Depression Maternal Uncle    Cancer Paternal Uncle    Colon cancer Maternal Grandmother    Heart disease Maternal Grandmother    Cancer Paternal Grandmother        Breast cancer    Colon polyps Neg Hx    Esophageal cancer Neg Hx    Rectal cancer Neg Hx    Stomach cancer Neg Hx    Social History[1]  Allergies[2]  Active Medications[3]  Objective: LMP 03/04/2015   Physical Exam:    General: Alert and oriented. and No acute distress. Gait: Normal gait.  Physical Exam EXTREMITIES: Left wrist without obvious deformity.  No obvious swelling.  There is ecchymosis in the volar aspect of the wrist.  Tenderness to palpation of the radial styloid.  She is able to wiggle all of her fingers.  She is able to make a fist, without power grip.  Fingers warm and well-perfused.   IMAGING: I personally ordered and reviewed the following images   X-rays of the left wrist were obtained in clinic today.  These are compared to prior x-rays.  There is a vertical shear fracture of the radial styloid.  Fracture line is visible.  There is intra-articular involvement without significant step-off.  Minimal displacement overall.  Compared to prior x-rays, there has been no overall change.  No bony lesions.  Impression: Minimally  displaced left radial styloid fracture     New Medications:  No orders of the defined types were placed in this encounter.     Portions of this note were completed via Scientist, clinical (histocompatibility and immunogenetics).  Oneil DELENA Horde, MD  11/18/2024 3:43 PM      [1]  Social History Tobacco Use   Smoking status: Former    Current packs/day: 0.00    Types: Cigarettes    Quit date: 03/18/2002    Years since quitting: 22.6   Smokeless tobacco: Never  Vaping Use   Vaping status: Never Used  Substance Use Topics   Alcohol use: Yes    Alcohol/week: 4.0 standard drinks of alcohol    Types: 4 Glasses of wine per week    Comment: 4-5 drinks per week per pt   Drug use: No  [2] No Known Allergies [3]  Current Meds  Medication Sig   ALPRAZolam  (XANAX ) 0.25 MG tablet TAKE 1 TABLET BY MOUTH AT BEDTIME AS NEEDED FOR ANXIETY   B  Complex Vitamins (B COMPLEX 50 PO) Take 1 tablet by mouth daily.   cholecalciferol (VITAMIN D ) 1000 units tablet Take 1,000 Units by mouth daily.   diphenhydrAMINE (BENADRYL) 25 MG tablet Take 25 mg by mouth at bedtime.   escitalopram  (LEXAPRO ) 20 MG tablet Take 1 tablet (20 mg total) by mouth daily.   hydrOXYzine  (ATARAX ) 50 MG tablet Take 1 tablet (50 mg total) by mouth 3 (three) times daily as needed. Take at night time   loteprednol (ALREX) 0.2 % SUSP Place 2 drops into the right eye 2 (two) times daily.   meloxicam  (MOBIC ) 15 MG tablet TAKE 1 TABLET BY MOUTH EVERY DAY AS NEEDED FOR PAIN

## 2024-12-02 ENCOUNTER — Ambulatory Visit: Admitting: Orthopedic Surgery
